# Patient Record
Sex: Female | Born: 1945 | Race: White | Hispanic: No | Marital: Single | Smoking: Never smoker
Health system: Southern US, Community
[De-identification: ages and names within clinical notes are randomized; demographics above are authoritative.]

## PROBLEM LIST (undated history)

## (undated) DIAGNOSIS — Z9889 Other specified postprocedural states: Secondary | ICD-10-CM

## (undated) DIAGNOSIS — F32A Depression, unspecified: Secondary | ICD-10-CM

## (undated) DIAGNOSIS — R519 Headache, unspecified: Secondary | ICD-10-CM

## (undated) DIAGNOSIS — M1990 Unspecified osteoarthritis, unspecified site: Secondary | ICD-10-CM

## (undated) DIAGNOSIS — F329 Major depressive disorder, single episode, unspecified: Secondary | ICD-10-CM

## (undated) DIAGNOSIS — M797 Fibromyalgia: Secondary | ICD-10-CM

## (undated) DIAGNOSIS — T4145XA Adverse effect of unspecified anesthetic, initial encounter: Secondary | ICD-10-CM

## (undated) DIAGNOSIS — T8859XA Other complications of anesthesia, initial encounter: Secondary | ICD-10-CM

## (undated) DIAGNOSIS — R112 Nausea with vomiting, unspecified: Secondary | ICD-10-CM

## (undated) DIAGNOSIS — C801 Malignant (primary) neoplasm, unspecified: Secondary | ICD-10-CM

## (undated) DIAGNOSIS — G4730 Sleep apnea, unspecified: Secondary | ICD-10-CM

## (undated) DIAGNOSIS — E119 Type 2 diabetes mellitus without complications: Secondary | ICD-10-CM

## (undated) DIAGNOSIS — E039 Hypothyroidism, unspecified: Secondary | ICD-10-CM

## (undated) DIAGNOSIS — F419 Anxiety disorder, unspecified: Secondary | ICD-10-CM

## (undated) DIAGNOSIS — I10 Essential (primary) hypertension: Secondary | ICD-10-CM

## (undated) HISTORY — PX: DILATION AND CURETTAGE OF UTERUS: SHX78

## (undated) HISTORY — PX: TONSILLECTOMY: SUR1361

## (undated) HISTORY — DX: Type 2 diabetes mellitus without complications: E11.9

---
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
## 2012-03-08 DIAGNOSIS — F329 Major depressive disorder, single episode, unspecified: Secondary | ICD-10-CM | POA: Insufficient documentation

## 2012-03-08 DIAGNOSIS — IMO0002 Reserved for concepts with insufficient information to code with codable children: Secondary | ICD-10-CM | POA: Insufficient documentation

## 2012-03-08 DIAGNOSIS — F32A Depression, unspecified: Secondary | ICD-10-CM | POA: Insufficient documentation

## 2012-03-08 DIAGNOSIS — I10 Essential (primary) hypertension: Secondary | ICD-10-CM | POA: Insufficient documentation

## 2012-03-08 DIAGNOSIS — G4733 Obstructive sleep apnea (adult) (pediatric): Secondary | ICD-10-CM | POA: Insufficient documentation

## 2012-03-08 DIAGNOSIS — B0239 Other herpes zoster eye disease: Secondary | ICD-10-CM | POA: Insufficient documentation

## 2012-03-08 DIAGNOSIS — E1065 Type 1 diabetes mellitus with hyperglycemia: Secondary | ICD-10-CM | POA: Insufficient documentation

## 2012-05-02 DIAGNOSIS — L255 Unspecified contact dermatitis due to plants, except food: Secondary | ICD-10-CM | POA: Insufficient documentation

## 2012-10-29 DIAGNOSIS — I517 Cardiomegaly: Secondary | ICD-10-CM | POA: Insufficient documentation

## 2012-11-21 DIAGNOSIS — E785 Hyperlipidemia, unspecified: Secondary | ICD-10-CM | POA: Insufficient documentation

## 2013-05-22 DIAGNOSIS — M79609 Pain in unspecified limb: Secondary | ICD-10-CM | POA: Insufficient documentation

## 2013-05-22 DIAGNOSIS — M7120 Synovial cyst of popliteal space [Baker], unspecified knee: Secondary | ICD-10-CM | POA: Insufficient documentation

## 2013-10-02 DIAGNOSIS — G4733 Obstructive sleep apnea (adult) (pediatric): Secondary | ICD-10-CM | POA: Diagnosis not present

## 2013-12-11 DIAGNOSIS — E039 Hypothyroidism, unspecified: Secondary | ICD-10-CM | POA: Insufficient documentation

## 2013-12-11 DIAGNOSIS — E785 Hyperlipidemia, unspecified: Secondary | ICD-10-CM | POA: Diagnosis not present

## 2013-12-11 DIAGNOSIS — E1065 Type 1 diabetes mellitus with hyperglycemia: Secondary | ICD-10-CM | POA: Diagnosis not present

## 2013-12-11 DIAGNOSIS — IMO0001 Reserved for inherently not codable concepts without codable children: Secondary | ICD-10-CM | POA: Diagnosis not present

## 2013-12-11 DIAGNOSIS — IMO0002 Reserved for concepts with insufficient information to code with codable children: Secondary | ICD-10-CM | POA: Diagnosis not present

## 2013-12-11 DIAGNOSIS — I10 Essential (primary) hypertension: Secondary | ICD-10-CM | POA: Diagnosis not present

## 2014-03-05 DIAGNOSIS — N390 Urinary tract infection, site not specified: Secondary | ICD-10-CM | POA: Diagnosis not present

## 2014-04-15 DIAGNOSIS — M26629 Arthralgia of temporomandibular joint, unspecified side: Secondary | ICD-10-CM | POA: Insufficient documentation

## 2014-04-15 DIAGNOSIS — R109 Unspecified abdominal pain: Secondary | ICD-10-CM | POA: Diagnosis not present

## 2014-05-06 DIAGNOSIS — H01009 Unspecified blepharitis unspecified eye, unspecified eyelid: Secondary | ICD-10-CM | POA: Diagnosis not present

## 2014-05-06 DIAGNOSIS — H04129 Dry eye syndrome of unspecified lacrimal gland: Secondary | ICD-10-CM | POA: Diagnosis not present

## 2014-06-17 DIAGNOSIS — H40009 Preglaucoma, unspecified, unspecified eye: Secondary | ICD-10-CM | POA: Diagnosis not present

## 2014-06-17 DIAGNOSIS — E119 Type 2 diabetes mellitus without complications: Secondary | ICD-10-CM | POA: Diagnosis not present

## 2014-06-17 DIAGNOSIS — H2510 Age-related nuclear cataract, unspecified eye: Secondary | ICD-10-CM | POA: Diagnosis not present

## 2014-07-01 DIAGNOSIS — G5622 Lesion of ulnar nerve, left upper limb: Secondary | ICD-10-CM | POA: Diagnosis not present

## 2014-07-01 DIAGNOSIS — M5412 Radiculopathy, cervical region: Secondary | ICD-10-CM | POA: Insufficient documentation

## 2014-07-02 DIAGNOSIS — E1065 Type 1 diabetes mellitus with hyperglycemia: Secondary | ICD-10-CM | POA: Diagnosis not present

## 2014-07-02 DIAGNOSIS — E039 Hypothyroidism, unspecified: Secondary | ICD-10-CM | POA: Diagnosis not present

## 2014-09-15 DIAGNOSIS — M545 Low back pain, unspecified: Secondary | ICD-10-CM | POA: Insufficient documentation

## 2014-09-15 DIAGNOSIS — R102 Pelvic and perineal pain: Secondary | ICD-10-CM | POA: Insufficient documentation

## 2014-09-23 DIAGNOSIS — M545 Low back pain: Secondary | ICD-10-CM | POA: Diagnosis not present

## 2014-09-23 DIAGNOSIS — M5136 Other intervertebral disc degeneration, lumbar region: Secondary | ICD-10-CM | POA: Diagnosis not present

## 2014-09-23 DIAGNOSIS — R102 Pelvic and perineal pain: Secondary | ICD-10-CM | POA: Diagnosis not present

## 2015-04-20 DIAGNOSIS — E119 Type 2 diabetes mellitus without complications: Secondary | ICD-10-CM | POA: Diagnosis not present

## 2015-04-20 DIAGNOSIS — I10 Essential (primary) hypertension: Secondary | ICD-10-CM | POA: Diagnosis not present

## 2015-04-20 DIAGNOSIS — E780 Pure hypercholesterolemia: Secondary | ICD-10-CM | POA: Diagnosis not present

## 2015-04-20 DIAGNOSIS — F322 Major depressive disorder, single episode, severe without psychotic features: Secondary | ICD-10-CM | POA: Diagnosis not present

## 2015-04-20 DIAGNOSIS — E039 Hypothyroidism, unspecified: Secondary | ICD-10-CM | POA: Diagnosis not present

## 2015-04-20 DIAGNOSIS — E11319 Type 2 diabetes mellitus with unspecified diabetic retinopathy without macular edema: Secondary | ICD-10-CM | POA: Diagnosis not present

## 2015-04-20 DIAGNOSIS — M797 Fibromyalgia: Secondary | ICD-10-CM | POA: Diagnosis not present

## 2015-04-20 DIAGNOSIS — Z794 Long term (current) use of insulin: Secondary | ICD-10-CM | POA: Diagnosis not present

## 2015-06-12 DIAGNOSIS — H2513 Age-related nuclear cataract, bilateral: Secondary | ICD-10-CM | POA: Diagnosis not present

## 2015-06-12 DIAGNOSIS — H02831 Dermatochalasis of right upper eyelid: Secondary | ICD-10-CM | POA: Diagnosis not present

## 2015-06-12 DIAGNOSIS — E10329 Type 1 diabetes mellitus with mild nonproliferative diabetic retinopathy without macular edema: Secondary | ICD-10-CM | POA: Diagnosis not present

## 2015-06-25 DIAGNOSIS — M79605 Pain in left leg: Secondary | ICD-10-CM | POA: Diagnosis not present

## 2015-07-10 DIAGNOSIS — E7800 Pure hypercholesterolemia, unspecified: Secondary | ICD-10-CM | POA: Diagnosis not present

## 2015-07-10 DIAGNOSIS — E039 Hypothyroidism, unspecified: Secondary | ICD-10-CM | POA: Diagnosis not present

## 2015-07-10 DIAGNOSIS — E119 Type 2 diabetes mellitus without complications: Secondary | ICD-10-CM | POA: Diagnosis not present

## 2015-07-24 DIAGNOSIS — M25562 Pain in left knee: Secondary | ICD-10-CM | POA: Diagnosis not present

## 2015-08-06 DIAGNOSIS — M1712 Unilateral primary osteoarthritis, left knee: Secondary | ICD-10-CM | POA: Diagnosis not present

## 2015-08-19 DIAGNOSIS — Z6841 Body Mass Index (BMI) 40.0 and over, adult: Secondary | ICD-10-CM | POA: Diagnosis not present

## 2015-08-19 DIAGNOSIS — E039 Hypothyroidism, unspecified: Secondary | ICD-10-CM | POA: Diagnosis not present

## 2015-08-19 DIAGNOSIS — Z13820 Encounter for screening for osteoporosis: Secondary | ICD-10-CM | POA: Diagnosis not present

## 2015-08-19 DIAGNOSIS — E1065 Type 1 diabetes mellitus with hyperglycemia: Secondary | ICD-10-CM | POA: Diagnosis not present

## 2015-08-19 DIAGNOSIS — Z794 Long term (current) use of insulin: Secondary | ICD-10-CM | POA: Diagnosis not present

## 2015-08-28 DIAGNOSIS — E1065 Type 1 diabetes mellitus with hyperglycemia: Secondary | ICD-10-CM | POA: Diagnosis not present

## 2015-08-28 DIAGNOSIS — E039 Hypothyroidism, unspecified: Secondary | ICD-10-CM | POA: Diagnosis not present

## 2015-08-28 DIAGNOSIS — E7800 Pure hypercholesterolemia, unspecified: Secondary | ICD-10-CM | POA: Diagnosis not present

## 2015-08-28 DIAGNOSIS — Z794 Long term (current) use of insulin: Secondary | ICD-10-CM | POA: Diagnosis not present

## 2015-09-11 DIAGNOSIS — Z13820 Encounter for screening for osteoporosis: Secondary | ICD-10-CM | POA: Diagnosis not present

## 2015-09-11 DIAGNOSIS — Z794 Long term (current) use of insulin: Secondary | ICD-10-CM | POA: Diagnosis not present

## 2015-09-11 DIAGNOSIS — E039 Hypothyroidism, unspecified: Secondary | ICD-10-CM | POA: Diagnosis not present

## 2015-09-11 DIAGNOSIS — E1065 Type 1 diabetes mellitus with hyperglycemia: Secondary | ICD-10-CM | POA: Diagnosis not present

## 2015-09-24 DIAGNOSIS — N959 Unspecified menopausal and perimenopausal disorder: Secondary | ICD-10-CM | POA: Diagnosis not present

## 2015-09-25 ENCOUNTER — Ambulatory Visit: Payer: Medicare Other | Admitting: *Deleted

## 2015-11-26 DIAGNOSIS — N3000 Acute cystitis without hematuria: Secondary | ICD-10-CM | POA: Diagnosis not present

## 2016-06-14 DIAGNOSIS — F322 Major depressive disorder, single episode, severe without psychotic features: Secondary | ICD-10-CM | POA: Diagnosis not present

## 2016-06-14 DIAGNOSIS — E039 Hypothyroidism, unspecified: Secondary | ICD-10-CM | POA: Diagnosis not present

## 2016-06-14 DIAGNOSIS — J069 Acute upper respiratory infection, unspecified: Secondary | ICD-10-CM | POA: Diagnosis not present

## 2016-06-14 DIAGNOSIS — M797 Fibromyalgia: Secondary | ICD-10-CM | POA: Diagnosis not present

## 2016-06-14 DIAGNOSIS — E7800 Pure hypercholesterolemia, unspecified: Secondary | ICD-10-CM | POA: Diagnosis not present

## 2016-07-08 DIAGNOSIS — Z794 Long term (current) use of insulin: Secondary | ICD-10-CM | POA: Diagnosis not present

## 2016-07-08 DIAGNOSIS — E1065 Type 1 diabetes mellitus with hyperglycemia: Secondary | ICD-10-CM | POA: Diagnosis not present

## 2016-07-08 DIAGNOSIS — E039 Hypothyroidism, unspecified: Secondary | ICD-10-CM | POA: Diagnosis not present

## 2016-08-12 ENCOUNTER — Encounter: Payer: Medicare Other | Attending: Internal Medicine | Admitting: *Deleted

## 2016-08-12 DIAGNOSIS — E109 Type 1 diabetes mellitus without complications: Secondary | ICD-10-CM | POA: Diagnosis not present

## 2016-08-12 DIAGNOSIS — E108 Type 1 diabetes mellitus with unspecified complications: Secondary | ICD-10-CM

## 2016-08-12 NOTE — Progress Notes (Signed)
Diabetes Self-Management Education  Visit Type: First/Initial  Appt. Start Time: 1000 Appt. End Time: 1100  08/12/2016  Ms. Christine Mcknight, identified by name and date of birth, is a 69 y.o. female with a diagnosis of Diabetes: Type 1. She developed Type 1 Diabetes at age 52, almost 59 years ago. She lives with her daughter and 2 grand daughters to help with child care. Her other daughter developed DM 1 at age 40, eventually needed dialysis for about 7 years before getting a pancreatic and kidney transplant. She states she is doing very well now. Patient is interested in being alerted to high and low BG's so would like to know more about Dexcom CGM.  ASSESSMENT  Height 5\' 5"  (1.651 m), weight 248 lb 9.6 oz (112.8 kg). Body mass index is 41.37 kg/m.      Diabetes Self-Management Education - 08/12/16 1001      Visit Information   Visit Type First/Initial     Initial Visit   Diabetes Type Type 1   Are you currently following a meal plan? Yes   What type of meal plan do you follow? Low Carb   Are you taking your medications as prescribed? Yes   Date Diagnosed 20 years ago     Health Coping   How would you rate your overall health? Good     Psychosocial Assessment   Self-care barriers None   Self-management support Family   Other persons present Patient   Patient Concerns Monitoring  interested in CGM   Special Needs None   What is the last grade level you completed in school? Graduate school     Complications   Last HgB A1C per patient/outside source 8.8 %   How often do you check your blood sugar? 3-4 times/day   Number of hypoglycemic episodes per month 4  typically around 4 PM   Have you had a dilated eye exam in the past 12 months? Yes   Have you had a dental exam in the past 12 months? No   Are you checking your feet? Yes   How many days per week are you checking your feet? 3     Dietary Intake   Breakfast skips   Lunch 11 AM: sandwich OR yogurt OR 1/2 sandwich  and 1/2 fruit   Snack (afternoon) no   Dinner 5 PM: meat, vegetables, occasionally starchy ones in small serivng   Snack (evening) no   Beverage(s) coffee with cream     Exercise   Exercise Type ADL's   How many days per week to you exercise? 0   How many minutes per day do you exercise? 0   Total minutes per week of exercise 0     Patient Education   Previous Diabetes Education Yes (please comment)   Nutrition management  Carbohydrate counting   Medications Other (comment)  Discussed treatment of high BG with 1/2 correction dose if only 2 hours since last injectionl   Monitoring Other (comment)  Discussed Dexcom CGM as they can be covered by Medicare   Acute complications Other (comment)  discussed prevention of hypoglycemia in the afternoon by having a snack     Individualized Goals (developed by patient)   Nutrition General guidelines for healthy choices and portions discussed   Physical Activity Exercise 5-7 days per week   Medications take my medication as prescribed   Monitoring  Other (comment)  move towards getting Dexcom CGM     Outcomes   Expected Outcomes  Demonstrated interest in learning. Expect positive outcomes   Future DMSE PRN   Program Status Completed      Individualized Plan for Diabetes Self-Management Training:   Learning Objective:  Patient will have a greater understanding of diabetes self-management. Patient education plan is to attend individual and/or group sessions per assessed needs and concerns.   Plan:   Patient Instructions  Plan: We discussed Dexcom CGM today as that product can be covered by Medicare. I have emailed the [facility] Rep so she can contact you and move forward. Consider when giving a correction dose of insulin within 2 hours of a previous injection, to give half the usual amount, as the previous insulin will be in your body for about 4 hours Consider having an afternoon snack of 1/2 to 1 carb choice and some lean protein to  help prevent low BG's in the later afternoon.  When your Dexcom ships, you can be trained by the [facility] rep or by me.  Hope you will be able to attend the Type 1 / Pump Support Group in the future.   Expected Outcomes:  Demonstrated interest in learning. Expect positive outcomes  Education material provided: Snack sheet, Dexcom brochure, Type 1 Support Group Flyer  If problems or questions, patient to contact team via:  Phone, emali  Future DSME appointment: PRN

## 2016-08-12 NOTE — Patient Instructions (Signed)
Plan: We discussed Dexcom CGM today as that product can be covered by Medicare. I have emailed the [facility] Rep so she can contact you and move forward. Consider when giving a correction dose of insulin within 2 hours of a previous injection, to give half the usual amount, as the previous insulin will be in your body for about 4 hours Consider having an afternoon snack of 1/2 to 1 carb choice and some lean protein to help prevent low BG's in the later afternoon.  When your Dexcom ships, you can be trained by the [facility] rep or by me.  Hope you will be able to attend the Type 1 / Pump Support Group in the future.

## 2016-08-31 DIAGNOSIS — Z794 Long term (current) use of insulin: Secondary | ICD-10-CM | POA: Diagnosis not present

## 2016-08-31 DIAGNOSIS — E1065 Type 1 diabetes mellitus with hyperglycemia: Secondary | ICD-10-CM | POA: Diagnosis not present

## 2016-10-09 DIAGNOSIS — Z23 Encounter for immunization: Secondary | ICD-10-CM | POA: Diagnosis not present

## 2016-12-30 DIAGNOSIS — Z794 Long term (current) use of insulin: Secondary | ICD-10-CM | POA: Diagnosis not present

## 2016-12-30 DIAGNOSIS — E1065 Type 1 diabetes mellitus with hyperglycemia: Secondary | ICD-10-CM | POA: Diagnosis not present

## 2016-12-30 DIAGNOSIS — E039 Hypothyroidism, unspecified: Secondary | ICD-10-CM | POA: Diagnosis not present

## 2017-01-12 DIAGNOSIS — N3000 Acute cystitis without hematuria: Secondary | ICD-10-CM | POA: Diagnosis not present

## 2017-02-17 DIAGNOSIS — E039 Hypothyroidism, unspecified: Secondary | ICD-10-CM | POA: Diagnosis not present

## 2017-02-17 DIAGNOSIS — Z794 Long term (current) use of insulin: Secondary | ICD-10-CM | POA: Diagnosis not present

## 2017-02-17 DIAGNOSIS — E1065 Type 1 diabetes mellitus with hyperglycemia: Secondary | ICD-10-CM | POA: Diagnosis not present

## 2017-03-17 ENCOUNTER — Ambulatory Visit: Payer: Medicare Other | Admitting: *Deleted

## 2017-03-31 ENCOUNTER — Encounter: Payer: Medicare Other | Attending: Internal Medicine | Admitting: *Deleted

## 2017-03-31 DIAGNOSIS — E1065 Type 1 diabetes mellitus with hyperglycemia: Secondary | ICD-10-CM | POA: Diagnosis not present

## 2017-03-31 DIAGNOSIS — E108 Type 1 diabetes mellitus with unspecified complications: Secondary | ICD-10-CM

## 2017-03-31 NOTE — Progress Notes (Signed)
Insulin Pump and / or CGM Evaluation Visit:  Date: 03/31/2017   Appt start time: 0800 end time:  0900.  Assessment:  This patient has DM 1 and their primary concerns today: to move towards Omnipod insulin pump. She already has the Dexcom CGM and likes it very much.   Patient currently is retired and takes care of her 2 grand children who live with her and her daughter Last A1c was 9.0%   Current barriers to managing their diabetes: Patient states:  they have had hypoglycemia 1-2 times in the past month  Frustrated with elevated A1c  MEDICATIONS: Basal Insulin: 28 units of Tresiba at night  Bolus Insulin: 10-15 units of Humalog at each meal  v Total Daily Dose of insulin per day 68  Progress Towards Obtaining an Insulin Pump Goal(s):   Patient states their expectations of pump therapy include: better control and ability to suspend or decrease basal insulin to prevent hypoglycemia Patient expresses understanding that for improved outcomes for their diabetes on an insulin pump they will:  During the first 1-2 weeks (or more) of new pump use, the patient will test BG pre and 2 hour post each meal, bedtime and 3 AM to assist the provider in evaluating accuracy of pump settings and to prevent DKA   Will test BG at least 4 times per day once evaluation time is completed  Change out pump infusion set at least every 3 days  Upload pump information to their pump's Web Based Program on a regular basis so provider can assess patterns and make setting adjustments.     Intervention:    Showed patient the following pumps: OmniPod per her request  Showed patient the following CGM: Medtronic, Dexcom  Demonstrated pump, insulin reservoir and infusion set options, and button pushing for bolus delivery of insulin through the pump  Patient aware of importance of testing BG at least 4 times per day for appropriate correction of high BG and prevention of DKA as applicable.  Emphasized importance of  follow up after Pump Start for appropriate pump setting adjustments and on-going training on more advanced features.  Patient informed of DM 1 / Pump Support Group activities and would like to be added to the email list.  Handouts given during visit include:  Insulin Pump and /or CGM Packet from Omnipod  Monitoring/Evaluation:    Patient does want to continue with pursuit of Omnipod insulin pump.    Patient asked me to contact local [facility] so they can start the process of obtaining the pump. I emailed them today. Contact information provided to the patient.  Once pump / CGM is shipped, patient to call this office to set up training.

## 2017-06-06 DIAGNOSIS — Z794 Long term (current) use of insulin: Secondary | ICD-10-CM | POA: Diagnosis not present

## 2017-06-06 DIAGNOSIS — E1065 Type 1 diabetes mellitus with hyperglycemia: Secondary | ICD-10-CM | POA: Diagnosis not present

## 2017-06-06 DIAGNOSIS — E039 Hypothyroidism, unspecified: Secondary | ICD-10-CM | POA: Diagnosis not present

## 2017-06-06 DIAGNOSIS — D692 Other nonthrombocytopenic purpura: Secondary | ICD-10-CM | POA: Diagnosis not present

## 2017-08-10 DIAGNOSIS — Z23 Encounter for immunization: Secondary | ICD-10-CM | POA: Diagnosis not present

## 2017-08-11 DIAGNOSIS — E039 Hypothyroidism, unspecified: Secondary | ICD-10-CM | POA: Diagnosis not present

## 2017-10-02 DIAGNOSIS — E039 Hypothyroidism, unspecified: Secondary | ICD-10-CM | POA: Diagnosis not present

## 2017-10-03 DIAGNOSIS — Z794 Long term (current) use of insulin: Secondary | ICD-10-CM | POA: Diagnosis not present

## 2017-10-03 DIAGNOSIS — E1065 Type 1 diabetes mellitus with hyperglycemia: Secondary | ICD-10-CM | POA: Diagnosis not present

## 2017-10-03 DIAGNOSIS — E039 Hypothyroidism, unspecified: Secondary | ICD-10-CM | POA: Diagnosis not present

## 2018-01-16 DIAGNOSIS — Z6841 Body Mass Index (BMI) 40.0 and over, adult: Secondary | ICD-10-CM | POA: Diagnosis not present

## 2018-01-16 DIAGNOSIS — E1065 Type 1 diabetes mellitus with hyperglycemia: Secondary | ICD-10-CM | POA: Diagnosis not present

## 2018-01-16 DIAGNOSIS — E039 Hypothyroidism, unspecified: Secondary | ICD-10-CM | POA: Diagnosis not present

## 2018-01-16 DIAGNOSIS — Z794 Long term (current) use of insulin: Secondary | ICD-10-CM | POA: Diagnosis not present

## 2018-02-09 DIAGNOSIS — Z23 Encounter for immunization: Secondary | ICD-10-CM | POA: Diagnosis not present

## 2018-02-09 DIAGNOSIS — F322 Major depressive disorder, single episode, severe without psychotic features: Secondary | ICD-10-CM | POA: Diagnosis not present

## 2018-02-09 DIAGNOSIS — E7800 Pure hypercholesterolemia, unspecified: Secondary | ICD-10-CM | POA: Diagnosis not present

## 2018-04-06 DIAGNOSIS — E109 Type 1 diabetes mellitus without complications: Secondary | ICD-10-CM | POA: Diagnosis not present

## 2018-04-06 DIAGNOSIS — G4733 Obstructive sleep apnea (adult) (pediatric): Secondary | ICD-10-CM | POA: Diagnosis not present

## 2018-04-06 DIAGNOSIS — E669 Obesity, unspecified: Secondary | ICD-10-CM | POA: Diagnosis not present

## 2018-04-06 DIAGNOSIS — E039 Hypothyroidism, unspecified: Secondary | ICD-10-CM | POA: Diagnosis not present

## 2018-05-14 DIAGNOSIS — E039 Hypothyroidism, unspecified: Secondary | ICD-10-CM | POA: Diagnosis not present

## 2018-05-14 DIAGNOSIS — Z6841 Body Mass Index (BMI) 40.0 and over, adult: Secondary | ICD-10-CM | POA: Diagnosis not present

## 2018-05-14 DIAGNOSIS — Z794 Long term (current) use of insulin: Secondary | ICD-10-CM | POA: Diagnosis not present

## 2018-05-14 DIAGNOSIS — E1065 Type 1 diabetes mellitus with hyperglycemia: Secondary | ICD-10-CM | POA: Diagnosis not present

## 2018-05-29 ENCOUNTER — Encounter: Payer: Self-pay | Admitting: Registered"

## 2018-05-29 ENCOUNTER — Encounter: Payer: Medicare Other | Attending: Surgery | Admitting: Registered"

## 2018-05-29 DIAGNOSIS — Z713 Dietary counseling and surveillance: Secondary | ICD-10-CM | POA: Diagnosis not present

## 2018-05-29 DIAGNOSIS — E669 Obesity, unspecified: Secondary | ICD-10-CM | POA: Insufficient documentation

## 2018-05-29 DIAGNOSIS — E109 Type 1 diabetes mellitus without complications: Secondary | ICD-10-CM

## 2018-05-29 NOTE — Progress Notes (Signed)
Pre-Op Assessment Visit:  Pre-Operative Sleeve Gastrectomy Surgery  Medical Nutrition Therapy:  Appt start time: 10:10 End time: 11:16  Patient was seen on 05/29/2018 for Pre-Operative Nutrition Assessment. Assessment and letter of approval faxed to [facility] Bariatric Surgery Program coordinator on 05/29/2018.   Pt expectation of surgery: reduction in daily insulin, improve energy, increase ability to move, improve joint pain  Pt expectation of Dietitian: none stated  Start weight at [facility]: 262.6 BMI: 44.73   Pt states she is taking 100 units of insulin a day and wants to reduce insulin intake. Pt states she feels enormously heavy and feels she will be physically better if weight decreases. 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 Pt states she helps take care of 2 granddaughters (ages 47 and 59). Pt states it is going to be rough to give up diet Coke but knows she can do it.   Per insurance, pt needs 0 SWL visits prior to surgery. Pt states her expected surgery date is February 2020.   24 hr Dietary Recall: First Meal: cereal Snack: none  Second Meal: sandwich (tuna or egg or [place]) + 1/2 fruit or boiled egg + [place] cheese roll up or leftovers Snack: none Third Meal: chili (with beans and corn) + sugar-free jello or barbecue chicken + corn Snack: none Beverages: Lactaid whole milk, 2 c coffee, sparkling water, diet coke, propel  Encouraged to engage in 75 minutes of moderate physical activity including cardiovascular and weight baring weekly  Handouts given during visit include:  . Pre-Op Goals . Bariatric Surgery Protein Shakes . Vitamin and Mineral Recommendations  During the appointment today the following Pre-Op Goals were reviewed with the patient: . Track your food and beverage: MyFitness Pal or Baritastic App . Make healthy food choices . Begin to limit portion sizes . Limited concentrated sugars and fried foods . Keep fat/sugar in the single digits per  serving on         food labels . Practice CHEWING your food  (aim for 30 chews per bite or until applesauce consistency) . Practice not drinking 15 minutes before, during, and 30 minutes after each meal/snack . Avoid all carbonated beverages  . Avoid/limit caffeinated beverages  . Avoid all sugar-sweetened beverages . Avoid alcohol . Consume 3 meals per day; eat every 3-5 hours . Make a list of non-food related activities . Aim for 64-100 ounces of FLUID daily  . Aim for at least 60-80 grams of PROTEIN daily . Look for a liquid protein source that contain ?15 g protein and ?5 g carbohydrate  (ex: shakes, drinks, shots) . Physical activity is an important part of a healthy lifestyle so keep it moving!  Follow diet recommendations listed below Energy and Macronutrient Recommendations: Calories: 1600 Carbohydrate: 180 Protein: 120 Fat: 44  Demonstrated degree of understanding via:  Teach Back   Teaching Method Utilized:  Visual Auditory Hands on  Barriers to learning/adherence to lifestyle change: none identified  Patient to call the [facility] to enroll in Pre-Op and Post-Op Nutrition Education when surgery date is scheduled.

## 2018-08-09 DIAGNOSIS — K5730 Diverticulosis of large intestine without perforation or abscess without bleeding: Secondary | ICD-10-CM | POA: Diagnosis not present

## 2018-08-09 DIAGNOSIS — K635 Polyp of colon: Secondary | ICD-10-CM | POA: Diagnosis not present

## 2018-08-09 DIAGNOSIS — D123 Benign neoplasm of transverse colon: Secondary | ICD-10-CM | POA: Diagnosis not present

## 2018-08-09 DIAGNOSIS — Z86010 Personal history of colonic polyps: Secondary | ICD-10-CM | POA: Diagnosis not present

## 2018-08-14 DIAGNOSIS — K635 Polyp of colon: Secondary | ICD-10-CM | POA: Diagnosis not present

## 2018-08-14 DIAGNOSIS — D123 Benign neoplasm of transverse colon: Secondary | ICD-10-CM | POA: Diagnosis not present

## 2018-08-21 DIAGNOSIS — M1990 Unspecified osteoarthritis, unspecified site: Secondary | ICD-10-CM | POA: Diagnosis not present

## 2018-08-21 DIAGNOSIS — E039 Hypothyroidism, unspecified: Secondary | ICD-10-CM | POA: Diagnosis not present

## 2018-08-21 DIAGNOSIS — E1065 Type 1 diabetes mellitus with hyperglycemia: Secondary | ICD-10-CM | POA: Diagnosis not present

## 2018-08-21 DIAGNOSIS — Z23 Encounter for immunization: Secondary | ICD-10-CM | POA: Diagnosis not present

## 2018-08-21 DIAGNOSIS — Z794 Long term (current) use of insulin: Secondary | ICD-10-CM | POA: Diagnosis not present

## 2018-08-30 ENCOUNTER — Ambulatory Visit: Payer: Medicare Other | Admitting: Podiatry

## 2018-08-31 ENCOUNTER — Ambulatory Visit (INDEPENDENT_AMBULATORY_CARE_PROVIDER_SITE_OTHER): Payer: Medicare Other | Admitting: Podiatry

## 2018-08-31 VITALS — BP 145/76

## 2018-08-31 DIAGNOSIS — Z794 Long term (current) use of insulin: Secondary | ICD-10-CM

## 2018-08-31 DIAGNOSIS — L84 Corns and callosities: Secondary | ICD-10-CM

## 2018-08-31 DIAGNOSIS — IMO0001 Reserved for inherently not codable concepts without codable children: Secondary | ICD-10-CM

## 2018-08-31 DIAGNOSIS — E119 Type 2 diabetes mellitus without complications: Secondary | ICD-10-CM | POA: Diagnosis not present

## 2018-08-31 NOTE — Patient Instructions (Signed)
Diabetes and Foot Care Diabetes may cause you to have problems because of poor blood supply (circulation) to your feet and legs. This may cause the skin on your feet to become thinner, break easier, and heal more slowly. Your skin may become dry, and the skin may peel and crack. You may also have nerve damage in your legs and feet causing decreased feeling in them. You may not notice minor injuries to your feet that could lead to infections or more serious problems. Taking care of your feet is one of the most important things you can do for yourself. Follow these instructions at home:  Wear shoes at all times, even in the house. Do not go barefoot. Bare feet are easily injured.  Check your feet daily for blisters, cuts, and redness. If you cannot see the bottom of your feet, use a mirror or ask someone for help.  Wash your feet with warm water (do not use hot water) and mild soap. Then pat your feet and the areas between your toes until they are completely dry. Do not soak your feet as this can dry your skin.  Apply a moisturizing lotion or petroleum jelly (that does not contain alcohol and is unscented) to the skin on your feet and to dry, brittle toenails. Do not apply lotion between your toes.  Trim your toenails straight across. Do not dig under them or around the cuticle. File the edges of your nails with an emery board or nail file.  Do not cut corns or calluses or try to remove them with medicine.  Wear clean socks or stockings every day. Make sure they are not too tight. Do not wear knee-high stockings since they may decrease blood flow to your legs.  Wear shoes that fit properly and have enough cushioning. To break in new shoes, wear them for just a few hours a day. This prevents you from injuring your feet. Always look in your shoes before you put them on to be sure there are no objects inside.  Do not cross your legs. This may decrease the blood flow to your feet.  If you find a  minor scrape, cut, or break in the skin on your feet, keep it and the skin around it clean and dry. These areas may be cleansed with mild soap and water. Do not cleanse the area with peroxide, alcohol, or iodine.  When you remove an adhesive bandage, be sure not to damage the skin around it.  If you have a wound, look at it several times a day to make sure it is healing.  Do not use heating pads or hot water bottles. They may burn your skin. If you have lost feeling in your feet or legs, you may not know it is happening until it is too late.  Make sure your health care provider performs a complete foot exam at least annually or more often if you have foot problems. Report any cuts, sores, or bruises to your health care provider immediately. Contact a health care provider if:  You have an injury that is not healing.  You have cuts or breaks in the skin.  You have an ingrown nail.  You notice redness on your legs or feet.  You feel burning or tingling in your legs or feet.  You have pain or cramps in your legs and feet.  Your legs or feet are numb.  Your feet always feel cold. Get help right away if:  There is increasing   redness, swelling, or pain in or around a wound.  There is a red line that goes up your leg.  Pus is coming from a wound.  You develop a fever or as directed by your health care provider.  You notice a bad smell coming from an ulcer or wound. This information is not intended to replace advice given to you by your health care provider. Make sure you discuss any questions you have with your health care provider. Document Released: 09/09/2000 Document Revised: 02/18/2016 Document Reviewed: 02/19/2013 Elsevier Interactive Patient Education  2017 Elsevier Inc.  Corns and Calluses Corns are small areas of thickened skin that occur on the top, sides, or tip of a toe. They contain a cone-shaped core with a point that can press on a nerve below. This causes pain.  Calluses are areas of thickened skin that can occur anywhere on the body including hands, fingers, palms, soles of the feet, and heels.Calluses are usually larger than corns. What are the causes? Corns and calluses are caused by rubbing (friction) or pressure, such as from shoes that are too tight or do not fit properly. What increases the risk? Corns are more likely to develop in people who have toe deformities, such as hammer toes. Since calluses can occur with friction to any area of the skin, calluses are more likely to develop in people who: Work with their hands. Wear shoes that fit poorly, shoes that are too tight, or shoes that are high-heeled. Have toes deformities.  What are the signs or symptoms? Symptoms of a corn or callus include: A hard growth on the skin. Pain or tenderness under the skin. Redness and swelling. Increased discomfort while wearing tight-fitting shoes.  How is this diagnosed? Corns and calluses may be diagnosed with a medical history and physical exam. How is this treated? Corns and calluses may be treated with: Removing the cause of the friction or pressure. This may include: Changing your shoes. Wearing shoe inserts (orthotics) or other protective layers in your shoes, such as a corn pad. Wearing gloves. Medicines to help soften skin in the hardened, thickened areas. Reducing the size of the corn or callus by removing the dead layers of skin. Antibiotic medicines to treat infection. Surgery, if a toe deformity is the cause.  Follow these instructions at home: Take medicines only as directed by your health care provider. If you were prescribed an antibiotic, finish all of it even if you start to feel better. Wear shoes that fit well. Avoid wearing high-heeled shoes and shoes that are too tight or too loose. Wear any padding, protective layers, gloves, or orthotics as directed by your health care provider. Soak your hands or feet and then use a file  or pumice stone to soften your corn or callus. Do this as directed by your health care provider. Check your corn or callus every day for signs of infection. Watch for: Redness, swelling, or pain. Fluid, blood, or pus. Contact a health care provider if: Your symptoms do not improve with treatment. You have increased redness, swelling, or pain at the site of your corn or callus. You have fluid, blood, or pus coming from your corn or callus. You have new symptoms. This information is not intended to replace advice given to you by your health care provider. Make sure you discuss any questions you have with your health care provider. Document Released: 06/18/2004 Document Revised: 04/01/2016 Document Reviewed: 09/08/2014 Elsevier Interactive Patient Education  2018 Elsevier Inc.  

## 2018-09-11 DIAGNOSIS — M7989 Other specified soft tissue disorders: Secondary | ICD-10-CM | POA: Diagnosis not present

## 2018-09-11 DIAGNOSIS — Z6841 Body Mass Index (BMI) 40.0 and over, adult: Secondary | ICD-10-CM | POA: Diagnosis not present

## 2018-09-11 DIAGNOSIS — M2550 Pain in unspecified joint: Secondary | ICD-10-CM | POA: Diagnosis not present

## 2018-09-11 DIAGNOSIS — R5383 Other fatigue: Secondary | ICD-10-CM | POA: Diagnosis not present

## 2018-09-11 DIAGNOSIS — M150 Primary generalized (osteo)arthritis: Secondary | ICD-10-CM | POA: Diagnosis not present

## 2018-09-11 DIAGNOSIS — R768 Other specified abnormal immunological findings in serum: Secondary | ICD-10-CM | POA: Diagnosis not present

## 2018-09-30 ENCOUNTER — Encounter: Payer: Self-pay | Admitting: Podiatry

## 2018-09-30 NOTE — Progress Notes (Signed)
Subjective: Christine Mcknight presents today referred by Jonathon Jordan, MD with diabetes. She is here for diabetic foot examination.   Past Medical History:  Diagnosis Date  . Diabetes mellitus without complication [facility])    Patient Active Problem List   Diagnosis Date Noted  . Low back pain 09/15/2014  . Pain, female pelvic 09/15/2014  . 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 Arthralgia of temporomandibular joint 04/15/2014  . Hypothyroidism 12/11/2013  . Pain in soft tissues of limb 05/22/2013  . Synovial cyst of popliteal space 05/22/2013  . Hyperlipidemia 11/21/2012  . LVH (left ventricular hypertrophy) 10/29/2012  . Contact dermatitis due to plants, except food 05/02/2012  . Benign essential hypertension 03/08/2012  . Depression 03/08/2012  . 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 Obstructive sleep apnea 03/08/2012  . Type I diabetes mellitus, uncontrolled ([place]) 03/08/2012    No past surgical history on file.   Current Outpatient Medications:  .  DULoxetine (CYMBALTA) 60 MG capsule, Take 60 mg by mouth daily., Disp: , Rfl:  .  FIASP FLEXTOUCH 100 UNIT/ML SOPN, , Disp: , Rfl:  .  levothyroxine (SYNTHROID) 125 MCG tablet, Take 125 mcg by mouth daily before breakfast., Disp: , Rfl:  .  polyethylene glycol-electrolytes (NULYTELY/GOLYTELY) 420 g solution, U UTD, Disp: , Rfl: 0 .  pravastatin (PRAVACHOL) 20 MG tablet, Take 20 mg by mouth daily., Disp: , Rfl:  .  SYNTHROID 112 MCG tablet, TK 1 T PO QD IN THE MORNING OES, Disp: , Rfl: 4 .  insulin degludec (TRESIBA FLEXTOUCH) 100 UNIT/ML SOPN FlexTouch Pen, Inject 28 Units into the skin daily at 10 pm., Disp: , Rfl:  .  insulin glargine (LANTUS) 100 UNIT/ML injection, Inject 26 Units into the skin daily., Disp: , Rfl:  .  insulin lispro (HUMALOG) 100 UNIT/ML injection, Inject into the skin 3 (three) times daily before meals. Sliding scale @@ 1 unit / 5 grams carb and 1 unit / 20 mg/dl above target of 100 mg/dl, Disp: , Rfl:   No  Known Allergies  Social History   Occupational History  . Not on file  Tobacco Use  . Smoking status: Not on file  Substance and Sexual Activity  . Alcohol use: Not on file  . Drug use: Not on file  . Sexual activity: Not on file    Family History  Problem Relation Age of Onset  . Cancer Other   . Diabetes Other      There is no immunization history on file for this patient.   Review of systems: Positive Findings in bold print.  Constitutional:  chills, fatigue, fever, sweats, weight change Communication: Optometrist, sign Ecologist, hand writing, iPad/Android device Eyes: diplopia, glare,  light sensitivity, eyeglasses, blindness Ears nose mouth throat: Hard of hearing, deaf, sign language,  vertigo,   bloody nose,  rhinitis,  cold sores, snoring Cardiovascular: HTN, edema, arrhythmia, pacemaker in place, defibrillator in place,  chest pain/tightness, chronic anticoagulation, blood clot Respiratory:  difficulty breathing, denies congestion, SOB, wheezing, cough Gastrointestinal: abdominal pain, diarrhea, nausea, vomiting,  Genitourinary:  nocturia,  pain on urination,  blood in urine, Foley catheter, urinary urgency Musculoskeletal: Uses mobility aid,  cramping, stiff joints, painful joints, low back pain Skin: +changes in toenails, color change dryness, itchy skin, mole changes, rash Neurological: numbness, paresthesias, burning in feet, denies fainting,  seizure, change in speech. denies headaches, memory problems/poor historian, cerebral palsy Endocrine: diabetes, hypothyroidism, hyperthyroidism,  dry mouth, flushing, denies heat intolerance,  cold intolerance,  excessive thirst,  denies polyuria,  nocturia Hematological:  easy bleeding,  excessive bleeding, easy bruising, enlarged lymph nodes, on long term blood thinner Allergy/immunological:  hive, frequent infections, multiple drug allergies, seasonal allergies,  Psychiatric:  anxiety, depression, mood disorder,  suicidal ideations, hallucinations   Objective: Vascular Examination: Capillary refill time immediate x 10 digits Dorsalis pedis and posterior tibial pulses present b/l Digital hair present x 10 digits Skin temperature gradient WNL b/l  Dermatological Examination: Skin with normal turgor, texture and tone b/l  Toenails 1-5 b/l adequate length and well maintained.  Hyperkeratotic lesion submetatarsal head 1 b/l  Musculoskeletal: Muscle strength 5/5 to all LE muscle groups  HAV with bunion b/l  Hammertoes 2-5 b/l   Neurological: Sensation intact with 10 gram monofilament  Vibratory sensation intact.  Assessment: 1. Calluses submet head 1 b/l 2. IDDM   Plan: 1. Discussed diabetic foot care principles. Literature dispensed on today. 2. Calluses pared submet head 1 b/l 3. Patient to continue soft, supportive shoe gear 4. Patient to report any pedal injuries to medical professional immediately. 5. Follow up 3 months. Patient/POA to call should there be a concern in the interim.

## 2018-10-02 DIAGNOSIS — Z6841 Body Mass Index (BMI) 40.0 and over, adult: Secondary | ICD-10-CM | POA: Diagnosis not present

## 2018-10-02 DIAGNOSIS — M150 Primary generalized (osteo)arthritis: Secondary | ICD-10-CM | POA: Diagnosis not present

## 2018-10-02 DIAGNOSIS — M7989 Other specified soft tissue disorders: Secondary | ICD-10-CM | POA: Diagnosis not present

## 2018-10-02 DIAGNOSIS — M2550 Pain in unspecified joint: Secondary | ICD-10-CM | POA: Diagnosis not present

## 2018-10-02 DIAGNOSIS — R768 Other specified abnormal immunological findings in serum: Secondary | ICD-10-CM | POA: Diagnosis not present

## 2018-10-02 DIAGNOSIS — R5383 Other fatigue: Secondary | ICD-10-CM | POA: Diagnosis not present

## 2018-10-08 ENCOUNTER — Encounter: Payer: Medicare Other | Attending: Surgery | Admitting: Skilled Nursing Facility1

## 2018-10-08 DIAGNOSIS — E669 Obesity, unspecified: Secondary | ICD-10-CM

## 2018-10-08 NOTE — Progress Notes (Signed)
Pre-Operative Nutrition Class:  Appt start time: 2707   End time:  1830.  Patient was seen on 10/08/2018 for Pre-Operative Bariatric Surgery Education [facility].   Surgery date:  Surgery type: sleeve Start weight at [facility]: 262.6 Weight today: 273.5  Samples given per MNT protocol. Patient educated on appropriate usage: Bariatric Advantage Multivitamin Lot # [id number] Exp: 4/21  Bariatric Advantage Calcium  Lot # [id number] Exp: 04/20/2019  Renee Pain Protein Shake Lot # [id number] Exp: march, 302020   The following the learning objectives were met by the patient during this course:  Identify Pre-Op Dietary Goals and will begin 2 weeks pre-operatively  Identify appropriate sources of fluids and proteins   State protein recommendations and appropriate sources pre and post-operatively  Identify Post-Operative Dietary Goals and will follow for 2 weeks post-operatively  Identify appropriate multivitamin and calcium sources  Describe the need for physical activity post-operatively and will follow MD recommendations  State when to call healthcare provider regarding medication questions or post-operative complications  Handouts given during class include:  Pre-Op Bariatric Surgery Diet Handout  Protein Shake Handout  Post-Op Bariatric Surgery Nutrition Handout  BELT Program Information Flyer  Support Group Information Flyer  [facility] Bariatric Supplements Price List  Follow-Up Plan: Patient will follow-up at [facility] 2 weeks post operatively for diet advancement per MD.

## 2018-10-09 ENCOUNTER — Ambulatory Visit (INDEPENDENT_AMBULATORY_CARE_PROVIDER_SITE_OTHER): Payer: Medicare Other | Admitting: Psychiatry

## 2018-10-09 DIAGNOSIS — F509 Eating disorder, unspecified: Secondary | ICD-10-CM | POA: Diagnosis not present

## 2018-11-06 DIAGNOSIS — M1990 Unspecified osteoarthritis, unspecified site: Secondary | ICD-10-CM | POA: Diagnosis not present

## 2018-11-06 DIAGNOSIS — E1065 Type 1 diabetes mellitus with hyperglycemia: Secondary | ICD-10-CM | POA: Diagnosis not present

## 2018-11-06 DIAGNOSIS — E039 Hypothyroidism, unspecified: Secondary | ICD-10-CM | POA: Diagnosis not present

## 2018-11-06 DIAGNOSIS — Z794 Long term (current) use of insulin: Secondary | ICD-10-CM | POA: Diagnosis not present

## 2018-11-20 ENCOUNTER — Ambulatory Visit (INDEPENDENT_AMBULATORY_CARE_PROVIDER_SITE_OTHER): Payer: Medicare Other | Admitting: Psychiatry

## 2018-11-20 DIAGNOSIS — F509 Eating disorder, unspecified: Secondary | ICD-10-CM

## 2018-11-20 DIAGNOSIS — E1065 Type 1 diabetes mellitus with hyperglycemia: Secondary | ICD-10-CM | POA: Diagnosis not present

## 2018-11-30 ENCOUNTER — Ambulatory Visit: Payer: Medicare Other | Admitting: Podiatry

## 2018-12-26 ENCOUNTER — Ambulatory Visit: Payer: Medicare Other | Admitting: Podiatry

## 2019-02-20 DIAGNOSIS — Z794 Long term (current) use of insulin: Secondary | ICD-10-CM | POA: Diagnosis not present

## 2019-02-20 DIAGNOSIS — E1065 Type 1 diabetes mellitus with hyperglycemia: Secondary | ICD-10-CM | POA: Diagnosis not present

## 2019-02-20 DIAGNOSIS — E039 Hypothyroidism, unspecified: Secondary | ICD-10-CM | POA: Diagnosis not present

## 2019-03-04 DIAGNOSIS — M2550 Pain in unspecified joint: Secondary | ICD-10-CM | POA: Diagnosis not present

## 2019-03-04 DIAGNOSIS — R768 Other specified abnormal immunological findings in serum: Secondary | ICD-10-CM | POA: Diagnosis not present

## 2019-03-04 DIAGNOSIS — M7989 Other specified soft tissue disorders: Secondary | ICD-10-CM | POA: Diagnosis not present

## 2019-03-04 DIAGNOSIS — R5383 Other fatigue: Secondary | ICD-10-CM | POA: Diagnosis not present

## 2019-03-04 DIAGNOSIS — M150 Primary generalized (osteo)arthritis: Secondary | ICD-10-CM | POA: Diagnosis not present

## 2019-03-05 DIAGNOSIS — M797 Fibromyalgia: Secondary | ICD-10-CM | POA: Diagnosis not present

## 2019-03-05 DIAGNOSIS — Z79899 Other long term (current) drug therapy: Secondary | ICD-10-CM | POA: Diagnosis not present

## 2019-03-05 DIAGNOSIS — Z1159 Encounter for screening for other viral diseases: Secondary | ICD-10-CM | POA: Diagnosis not present

## 2019-03-05 DIAGNOSIS — F322 Major depressive disorder, single episode, severe without psychotic features: Secondary | ICD-10-CM | POA: Diagnosis not present

## 2019-03-05 DIAGNOSIS — E7800 Pure hypercholesterolemia, unspecified: Secondary | ICD-10-CM | POA: Diagnosis not present

## 2019-04-02 DIAGNOSIS — I10 Essential (primary) hypertension: Secondary | ICD-10-CM | POA: Diagnosis not present

## 2019-04-04 ENCOUNTER — Other Ambulatory Visit: Payer: Self-pay | Admitting: Surgery

## 2019-04-04 ENCOUNTER — Other Ambulatory Visit (HOSPITAL_COMMUNITY): Payer: Self-pay | Admitting: Surgery

## 2019-04-04 DIAGNOSIS — E669 Obesity, unspecified: Secondary | ICD-10-CM

## 2019-04-09 ENCOUNTER — Other Ambulatory Visit (HOSPITAL_COMMUNITY): Payer: Self-pay | Admitting: Surgery

## 2019-04-09 ENCOUNTER — Other Ambulatory Visit: Payer: Self-pay | Admitting: Surgery

## 2019-04-09 DIAGNOSIS — E669 Obesity, unspecified: Secondary | ICD-10-CM

## 2019-04-16 DIAGNOSIS — E039 Hypothyroidism, unspecified: Secondary | ICD-10-CM | POA: Diagnosis not present

## 2019-04-16 DIAGNOSIS — I10 Essential (primary) hypertension: Secondary | ICD-10-CM | POA: Diagnosis not present

## 2019-04-16 DIAGNOSIS — E559 Vitamin D deficiency, unspecified: Secondary | ICD-10-CM | POA: Diagnosis not present

## 2019-04-16 DIAGNOSIS — E1065 Type 1 diabetes mellitus with hyperglycemia: Secondary | ICD-10-CM | POA: Diagnosis not present

## 2019-04-16 DIAGNOSIS — M797 Fibromyalgia: Secondary | ICD-10-CM | POA: Diagnosis not present

## 2019-04-16 DIAGNOSIS — E7800 Pure hypercholesterolemia, unspecified: Secondary | ICD-10-CM | POA: Diagnosis not present

## 2019-04-16 DIAGNOSIS — Z794 Long term (current) use of insulin: Secondary | ICD-10-CM | POA: Diagnosis not present

## 2019-04-16 DIAGNOSIS — Z79899 Other long term (current) drug therapy: Secondary | ICD-10-CM | POA: Diagnosis not present

## 2019-04-16 DIAGNOSIS — Z1159 Encounter for screening for other viral diseases: Secondary | ICD-10-CM | POA: Diagnosis not present

## 2019-04-24 ENCOUNTER — Other Ambulatory Visit: Payer: Self-pay

## 2019-04-24 ENCOUNTER — Ambulatory Visit (HOSPITAL_COMMUNITY)
Admission: RE | Admit: 2019-04-24 | Discharge: 2019-04-24 | Disposition: A | Discharge status: Home / Self Care | Payer: Medicare Other | Source: Ambulatory Visit | Attending: Surgery | Admitting: Surgery

## 2019-04-24 DIAGNOSIS — E669 Obesity, unspecified: Secondary | ICD-10-CM

## 2019-05-02 ENCOUNTER — Ambulatory Visit (INDEPENDENT_AMBULATORY_CARE_PROVIDER_SITE_OTHER): Payer: Medicare Other | Admitting: Psychiatry

## 2019-05-02 DIAGNOSIS — F509 Eating disorder, unspecified: Secondary | ICD-10-CM | POA: Diagnosis not present

## 2019-05-15 ENCOUNTER — Ambulatory Visit: Payer: Self-pay | Admitting: Surgery

## 2019-05-15 DIAGNOSIS — E109 Type 1 diabetes mellitus without complications: Secondary | ICD-10-CM | POA: Diagnosis not present

## 2019-05-15 DIAGNOSIS — E039 Hypothyroidism, unspecified: Secondary | ICD-10-CM | POA: Diagnosis not present

## 2019-05-15 DIAGNOSIS — G4733 Obstructive sleep apnea (adult) (pediatric): Secondary | ICD-10-CM | POA: Diagnosis not present

## 2019-05-15 DIAGNOSIS — E669 Obesity, unspecified: Secondary | ICD-10-CM | POA: Diagnosis not present

## 2019-05-15 NOTE — H&P (Signed)
Surgical H&P  CC: obesity  HPI: following up today for management of morbid obesity.  I consulted with her just over a year ago and at that time we planned to initiate the bariatric pathway. She reports no major changes in her health and she remains eager to proceed with surgical treatment of her obesity.  She has consulted with the dietitian and psychologist and has been approved from their standpoint to be an excellent candidate for sleeve gastrectomy. Chest x-ray and upper GI were normal, no hiatal hernia. Reportedly has had labs including a hemoglobin A1c, B12, iron, pregnancy test, thyroxine, urinalysis, H. Pylori folic acid through her primary care doctor.  She reports stress related to the pandemic. 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 Reports school from home has been a disaster.   Initial viait 04/06/18: This is a very pleasant and relatively fit 73 year old woman who is here to discuss bariatric surgery. She has been struggling with her weight since her children were born many years ago. She has tried different types of exercises and numerous diets, most recently the keto diet last year which did afford about a 15 pound weight loss but was pretty much unbearable in terms of side effects. 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 She feels depressed about her weight. She has type 1 diabetes which was diagnosed late in life, age 56. Her daughter was diagnosed at age 56 and ultimately has undergone a sleeve gastrectomy followed by kidney pancreas transplant with great results. She also has a history of hypothyroidism and sleep apnea. States that she has no known heart or lung disease.  No tobacco drug or alcohol use.  The patient is a 73 year old female who presents for a bariatric surgery evaluation.Associated  symptoms include depressed mood, lost range of motion and joint pains. Initial onset of obesity was after pregnancy. Initial presentation included inability to lose weight. Disease complications include diabetes (type 1, late onset at age 89) and sleep apnea. Current diet includes well balanced meals. less than once per week. Pertinent family history includes obesity, diabetes and endocrine disorder. The patient is currently able to do activities of daily living without limitations, able to work with limitations and able to do housework without limitations. Surgical history: C-section - elective. Cardiac history: The patient denies smoking. Gastrointestinal History: Patient denies heartburn, dysphagia or irritable bowel disease. MBSQIP recognized comorbidities: Patient has diabetes mellitus and sleep apnea.  No Known Allergies  Past Medical History:  Diagnosis Date  . Diabetes mellitus without complication (HCC)     PSH: multiple c-sections  Family History  Problem Relation Age of Onset  . Cancer Other   . Diabetes Other     Social History   Socioeconomic History  . Marital status: Single    Spouse name: Not on file  . Number of children: Not on file  . Years of education: Not on file  . Highest education level: Not on file  Occupational History  . Not on file  Social Needs  . Financial resource strain: Not on file  . Food insecurity    Worry: Not on file    Inability: Not on file  . Transportation needs    Medical: Not on file    Non-medical: Not on file  Tobacco Use  . Smoking status: Never Smoker  .  Smokeless tobacco: Never Used  Substance and Sexual Activity  . Alcohol use: Yes    Comment: occasionally  . Drug use: Not on file  . Sexual activity: Not on file  Lifestyle  . Physical activity    Days per week: Not on file    Minutes per session: Not on file  . Stress: Not on file  Relationships  . Social Herbalist on phone: Not on file    Gets together:  Not on file    Attends religious service: Not on file    Active member of club or organization: Not on file    Attends meetings of clubs or organizations: Not on file    Relationship status: Not on file  Other Topics Concern  . Not on file  Social History Narrative  . Not on file    Current Outpatient Medications on File Prior to Visit  Medication Sig Dispense Refill  . DULoxetine (CYMBALTA) 60 MG capsule Take 60 mg by mouth daily.    Marland Kitchen FIASP FLEXTOUCH 100 UNIT/ML SOPN     . insulin degludec (TRESIBA FLEXTOUCH) 100 UNIT/ML SOPN FlexTouch Pen Inject 28 Units into the skin daily at 10 pm.    . insulin glargine (LANTUS) 100 UNIT/ML injection Inject 26 Units into the skin daily.    . insulin lispro (HUMALOG) 100 UNIT/ML injection Inject into the skin 3 (three) times daily before meals. Sliding scale @@ 1 unit / 5 grams carb and 1 unit / 20 mg/dl above target of 100 mg/dl    . levothyroxine (SYNTHROID) 125 MCG tablet Take 125 mcg by mouth daily before breakfast.    . polyethylene glycol-electrolytes (NULYTELY/GOLYTELY) 420 g solution U UTD  0  . pravastatin (PRAVACHOL) 20 MG tablet Take 20 mg by mouth daily.    Marland Kitchen SYNTHROID 112 MCG tablet TK 1 T PO QD IN THE MORNING OES  4   No current facility-administered medications on file prior to visit.     Review of Systems: a complete, 10pt review of systems was completed with pertinent positives and negatives as documented in the HPI  Physical Exam: Vitals (Brown; 05/15/2019 9:22 AM) 05/15/2019 9:21 AM Weight: 271 lb Height: 63in Neck: 16in Body Surface Area: 2.2 m Body Mass Index: 48.01 kg/m  Temp.: 96.34F(Temporal)  Pulse: 98 (Regular)  Resp.: 16 (Unlabored)  P.OX: 93% (Room air) BP: 152/74 (Sitting, Left Arm, Standard)  Alert and cooperative. Extraocular motions intact, no scleral icterus Speech is normal Respirations are even and unlabored No lower extremity edema   No flowsheet data found.  No  flowsheet data found.  No results found for: INR, PROTIME  Imaging: No results found.   A/P: Morbid obesity with comorbidities of arthritis, back pain, depression, type 1 diabetes, hypercholesterolemia, migraines, obstructive sleep apnea, hypothyroid. Remains an appropriate candidate for sleeve gastrectomy. Although she does have type 1 diabetes, given her age I think that a sleeve gastrectomy would be the better option as it will have a good effect on her insulin resistance as well. We again discussed the surgery including technical aspects, the risks of bleeding, infection, pain, scarring, injury to intra-abdominal structures, staple line leak or abscess, chronic abdominal pain or nausea, new onset or worsened GERD, DVT/PE, pneumonia, heart attack, stroke, death, failure to reach weight loss goals and weight regain, hernia. Discussed the typical peri- and postoperative course. Discussed the importance of lifelong behavioral changes to combat the chronic and relapsing disease which is obesity. She has  done a good amount of research and reading reveals prepared to go forward. Questions were answered. Plan to proceed pending insurance authorization.  Romana Juniper, MD [facility], [place] Pager [phone]

## 2019-05-20 DIAGNOSIS — Z794 Long term (current) use of insulin: Secondary | ICD-10-CM | POA: Diagnosis not present

## 2019-05-20 DIAGNOSIS — E1065 Type 1 diabetes mellitus with hyperglycemia: Secondary | ICD-10-CM | POA: Diagnosis not present

## 2019-05-20 DIAGNOSIS — E039 Hypothyroidism, unspecified: Secondary | ICD-10-CM | POA: Diagnosis not present

## 2019-05-28 ENCOUNTER — Telehealth: Payer: Self-pay | Admitting: Dietician

## 2019-05-28 NOTE — Telephone Encounter (Signed)
I attempted to call patient to assess their understanding of the pre-op nutrition recommendations to ensure readiness for upcoming surgery on 06/25/2019.  Patient was unavailable, I LVM asking them to return the call.    [facility]) Short, MS, RD, LDN

## 2019-05-29 ENCOUNTER — Telehealth: Payer: Self-pay | Admitting: Dietician

## 2019-05-29 NOTE — Telephone Encounter (Signed)
I attempted to call patient to assess their understanding of the pre-op nutrition recommendations to ensure readiness for upcoming surgery on 06/25/2019.  Patient was unavailable, I LVM asking them to return the call.    Christine Mcknight) Vonzell Lindblad, MS, RD, LDN

## 2019-06-10 ENCOUNTER — Encounter: Payer: Medicare Other | Attending: Surgery | Admitting: Skilled Nursing Facility1

## 2019-06-10 ENCOUNTER — Ambulatory Visit: Payer: Medicare Other

## 2019-06-10 ENCOUNTER — Other Ambulatory Visit: Payer: Self-pay

## 2019-06-10 DIAGNOSIS — E108 Type 1 diabetes mellitus with unspecified complications: Secondary | ICD-10-CM | POA: Insufficient documentation

## 2019-06-10 DIAGNOSIS — E669 Obesity, unspecified: Secondary | ICD-10-CM | POA: Diagnosis not present

## 2019-06-11 NOTE — Progress Notes (Signed)
Pre-Operative Nutrition Class:  Appt start time: 1030   End time:  1830.  Patient was seen on 06/10/2019 for Pre-Operative Bariatric Surgery Education [facility].   Surgery date:  Surgery type: sleeve [facility] weight at [facility]: 262.2 Weight today: 270.8   The following the learning objectives were met by the patient during this course:  Identify Pre-Op Dietary Goals and will begin 2 weeks pre-operatively  Identify appropriate sources of fluids and proteins   State protein recommendations and appropriate sources pre and post-operatively  Identify Post-Operative Dietary Goals and will follow for 2 weeks post-operatively  Identify appropriate multivitamin and calcium sources  Describe the need for physical activity post-operatively and will follow MD recommendations  State when to call healthcare provider regarding medication questions or post-operative complications  Handouts given during class include:  Pre-Op Bariatric Surgery Diet Handout  Protein Shake Handout  Post-Op Bariatric Surgery Nutrition Handout  [facility]EL[facility] Program Information Flyer  Support Group Information Flyer  [facility] Bariatric Supplements Price List  Follow-Up Plan: Patient will follow-up at [facility] 2 weeks post operatively for diet advancement per MD.

## 2019-06-12 DIAGNOSIS — E559 Vitamin D deficiency, unspecified: Secondary | ICD-10-CM | POA: Diagnosis not present

## 2019-06-12 DIAGNOSIS — E7800 Pure hypercholesterolemia, unspecified: Secondary | ICD-10-CM | POA: Diagnosis not present

## 2019-06-12 DIAGNOSIS — E039 Hypothyroidism, unspecified: Secondary | ICD-10-CM | POA: Diagnosis not present

## 2019-06-17 ENCOUNTER — Encounter (HOSPITAL_COMMUNITY): Payer: Self-pay

## 2019-06-17 NOTE — Progress Notes (Signed)
PCP - Jonathon Jordan  lov 04-16-19 on chart Cardiologist -    endocrinologist Dr. Buddy Duty Chest x-ray - 04-24-19 epic EKG - 04-24-19 epic Stress Test -  ECHO -  Cardiac Cath -   Sleep Study -  CPAP - yes  Fasting Blood Sugar - 120-230 Checks Blood Sugar __continous dexcon glucose monitor___  TYPE 1  DM   Blood Thinner Instructions: Aspirin Instructions: Last Dose:  Anesthesia review: HGBa1c 9.0, 286 BS finger stick 330 on  CMP  Patient denies shortness of breath, fever, cough and chest pain at PAT appointment   none   Patient verbalized understanding of instructions that were given to them at the PAT appointment. Patient was also instructed that they will need to review over the PAT instructions again at home before surgery.

## 2019-06-17 NOTE — Patient Instructions (Addendum)
DUE TO COVID-19 ONLY ONE VISITOR IS ALLOWED TO COME WITH YOU AND STAY IN THE WAITING ROOM ONLY DURING PRE OP AND PROCEDURE DAY OF SURGERY. THE 1 VISITOR MAY VISIT WITH YOU AFTER SURGERY IN YOUR PRIVATE ROOM DURING VISITING HOURS ONLY!  YOU NEED TO HAVE A COVID 19 TEST ON_______ @_______ , THIS TEST MUST BE DONE BEFORE SURGERY, COME  [place] , [address].  ([place]) ONCE YOUR COVID TEST IS COMPLETED, PLEASE BEGIN THE QUARANTINE INSTRUCTIONS AS OUTLINED IN YOUR HANDOUT.                Hanalei Mockus  06/17/2019   Your procedure is scheduled on: 06-25-19   Report to [facility] Main  Entrance   Report to short stay  at    0530 AM     Call this number if you have problems the morning of surgery [phone]    Remember: MORNING OF SURGERY DRINK:   DRINK 1 G2 drink BEFORE YOU LEAVE HOME, DRINK ALL OF THE  G2 DRINK AT ONE TIME.   NO SOLID FOOD AFTER 600 PM THE NIGHT BEFORE YOUR SURGERY. YOU MAY DRINK CLEAR FLUIDS. THE G2 DRINK YOU DRINK BEFORE YOU LEAVE HOME WILL BE THE LAST FLUIDS YOU DRINK BEFORE SURGERY.  PAIN IS EXPECTED AFTER SURGERY AND WILL NOT BE COMPLETELY ELIMINATED. AMBULATION AND TYLENOL WILL HELP REDUCE INCISIONAL AND GAS PAIN. MOVEMENT IS KEY!  YOU ARE EXPECTED TO BE OUT OF BED WITHIN 4 HOURS OF ADMISSION TO YOUR PATIENT ROOM.  SITTING IN THE RECLINER THROUGHOUT THE DAY IS IMPORTANT FOR DRINKING FLUIDS AND MOVING GAS THROUGHOUT THE GI TRACT.  COMPRESSION STOCKINGS SHOULD BE WORN [place] UNLESS YOU ARE WALKING.   INCENTIVE SPIROMETER SHOULD BE USED EVERY HOUR WHILE AWAKE TO DECREASE POST-OPERATIVE COMPLICATIONS SUCH AS PNEUMONIA.  WHEN DISCHARGED HOME, IT IS IMPORTANT TO CONTINUE TO WALK EVERY HOUR AND USE THE INCENTIVE SPIROMETER EVERY HOUR.       CLEAR LIQUID DIET   Foods Allowed                                                                     Foods Excluded  Coffee and tea, regular and decaf                              liquids that you cannot  Plain Jell-O any favor except red or purple                                           see through such as: Fruit ices (not with fruit pulp)                                     milk, soups, orange juice  Iced Popsicles                                    All  solid food Carbonated beverages, regular and diet                                    Cranberry, grape and apple juices Sports drinks like Gatorade Lightly seasoned clear broth or consume(fat free) Sugar, honey syrup  Sample Menu Breakfast                                Lunch                                     Supper Cranberry juice                    Beef broth                            Chicken broth Jell-O                                     Grape juice                           Apple juice Coffee or tea                        Jell-O                                      Popsicle                                                Coffee or tea                        Coffee or tea  _____________________________________________________________________         .  BRUSH YOUR TEETH MORNING OF SURGERY AND RINSE YOUR MOUTH OUT, NO CHEWING GUM CANDY OR MINTS.     Take these medicines the morning of surgery with A SIP OF WATER: prevastatin, levothyroxine, cymbalta                                 You may not have any metal on your body including hair pins and              piercings  Do not wear jewelry, make-up, lotions, powders or perfumes, deodorant             Do not wear nail polish.  Do not shave  48 hours prior to surgery.     Do not bring valuables to the hospital. [place].  Contacts, dentures or bridgework may not be worn into surgery.                 Please read  over the following fact sheets you were given: _____________________________________________________________________ How to Manage Your Diabetes Before and After  Surgery  Why is it important to control my blood sugar before and after surgery? . Improving blood sugar levels before and after surgery helps healing and can limit problems. . A way of improving blood sugar control is eating a healthy diet by: o  Eating less sugar and carbohydrates o  Increasing activity/exercise o  Talking with your doctor about reaching your blood sugar goals . High blood sugars (greater than 180 mg/dL) can raise your risk of infections and slow your recovery, so you will need to focus on controlling your diabetes during the weeks before surgery. . Make sure that the doctor who takes care of your diabetes knows about your planned surgery including the date and location.  How do I manage my blood sugar before surgery? . Check your blood sugar at least 4 times a day, starting 2 days before surgery, to make sure that the level is not too high or low. o Check your blood sugar the morning of your surgery when you wake up and every 2 hours until you get to the Short Stay unit. . If your blood sugar is less than 70 mg/dL, you will need to treat for low blood sugar: o Do not take insulin. o Treat a low blood sugar (less than 70 mg/dL) with  cup of clear juice (cranberry or apple), 4 glucose tablets, OR glucose gel. o Recheck blood sugar in 15 minutes after treatment (to make sure it is greater than 70 mg/dL). If your blood sugar is not greater than 70 mg/dL on recheck, call [phone] for further instructions. . Report your blood sugar to the short stay nurse when you get to Short Stay.  . If you are admitted to the hospital after surgery: o Your blood sugar will be checked by the staff and you will probably be given insulin after surgery (instead of oral diabetes medicines) to make sure you have good blood sugar levels. o The goal for blood sugar control after surgery is 80-180 mg/dL.   WHAT DO I DO ABOUT MY DIABETES MEDICATION?  Marland Kitchen Do not take oral diabetes medicines  (pills) the morning of surgery.  . THE   DAY  BEFORE SURGERY, take all of Tresbia  AM dose          Take Fiasp as usual day before surgery none at bedtime        If BS > 220 follow instructions below for day of surgery                                                    . THE MORNING OF SURGERY,  Type 1 diabetics take 80 % of Tresbia insulin     . The day of surgery, do not take other diabetes injectables, including Byetta (exenatide), Bydureon (exenatide ER), Victoza (liraglutide), or Trulicity (dulaglutide).  . If your CBG is greater than 220 mg/dL, you may take  of your sliding scale  . (correction) dose of insulin.              [facility] - Preparing for Surgery Before surgery, you can play an important role.  Because skin is not sterile, your skin needs to be as free of germs as possible.  You can  reduce the number of germs on your skin by washing with CHG (chlorahexidine gluconate) soap before surgery.  CHG is an antiseptic cleaner which kills germs and bonds with the skin to continue killing germs even after washing. Please DO NOT use if you have an allergy to CHG or antibacterial soaps.  If your skin becomes reddened/irritated stop using the CHG and inform your nurse when you arrive at Short Stay. Do not shave (including legs and underarms) for at least 48 hours prior to the first CHG shower.  You may shave your face/neck. Please follow these instructions carefully:  1.  Shower with CHG Soap the night before surgery and the  morning of Surgery.  2.  If you choose to wash your hair, wash your hair first as usual with your  normal  shampoo.  3.  After you shampoo, rinse your hair and body thoroughly to remove the  shampoo.                           4.  Use CHG as you would any other liquid soap.  You can apply chg directly  to the skin and wash                       Gently with a scrungie or clean washcloth.  5.  Apply the CHG Soap to your body ONLY FROM THE NECK DOWN.   Do not use on  face/ open                           Wound or open sores. Avoid contact with eyes, ears mouth and genitals (private parts).                       Wash face,  Genitals (private parts) with your normal soap.             6.  Wash thoroughly, paying special attention to the area where your surgery  will be performed.  7.  Thoroughly rinse your body with warm water from the neck down.  8.  DO NOT shower/wash with your normal soap after using and rinsing off  the CHG Soap.                9.  Pat yourself dry with a clean towel.            10.  Wear clean pajamas.            11.  Place clean sheets on your bed the night of your first shower and do not  sleep with pets. Day of Surgery : Do not apply any lotions/deodorants the morning of surgery.  Please wear clean clothes to the hospital/surgery center.  FAILURE TO FOLLOW THESE INSTRUCTIONS MAY RESULT IN THE CANCELLATION OF YOUR SURGERY PATIENT SIGNATURE_________________________________  NURSE SIGNATURE__________________________________  ________________________________________________________________________   Adam Phenix  An incentive spirometer is a tool that can help keep your lungs clear and active. This tool measures how well you are filling your lungs with each breath. Taking long deep breaths may help reverse or decrease the chance of developing breathing (pulmonary) problems (especially infection) following:  A long period of time when you are unable to move or be active. BEFORE THE PROCEDURE   If the spirometer includes an indicator to show your best effort, your nurse or respiratory therapist will set it to  a desired goal.  If possible, sit up straight or lean slightly forward. Try not to slouch.  Hold the incentive spirometer in an upright position. INSTRUCTIONS FOR USE  1. Sit on the edge of your bed if possible, or sit up as far as you can in bed or on a chair. 2. Hold the incentive spirometer in an upright  position. 3. Breathe out normally. 4. Place the mouthpiece in your mouth and seal your lips tightly around it. 5. Breathe in slowly and as deeply as possible, raising the piston or the ball toward the top of the column. 6. Hold your breath for 3-5 seconds or for as long as possible. Allow the piston or ball to fall to the bottom of the column. 7. Remove the mouthpiece from your mouth and breathe out normally. 8. Rest for a few seconds and repeat Steps 1 through 7 at least 10 times every 1-2 hours when you are awake. Take your time and take a few normal breaths between deep breaths. 9. The spirometer may include an indicator to show your best effort. Use the indicator as a goal to work toward during each repetition. 10. After each set of 10 deep breaths, practice coughing to be sure your lungs are clear. If you have an incision (the cut made at the time of surgery), support your incision when coughing by placing a pillow or rolled up towels firmly against it. Once you are able to get out of bed, walk around indoors and cough well. You may stop using the incentive spirometer when instructed by your caregiver.  RISKS AND COMPLICATIONS  Take your time so you do not get dizzy or light-headed.  If you are in pain, you may need to take or ask for pain medication before doing incentive spirometry. It is harder to take a deep breath if you are having pain. AFTER USE  Rest and breathe slowly and easily.  It can be helpful to keep track of a log of your progress. Your caregiver can provide you with a simple table to help with this. If you are using the spirometer at home, follow these instructions: [place] IF:   You are having difficultly using the spirometer.  You have trouble using the spirometer as often as instructed.  Your pain medication is not giving enough relief while using the spirometer.  You develop fever of 100.5 F (38.1 C) or higher. SEEK IMMEDIATE MEDICAL CARE IF:    You cough up bloody sputum that had not been present before.  You develop fever of 102 F (38.9 C) or greater.  You develop worsening pain at or near the incision site. MAKE SURE YOU:   Understand these instructions.  Will watch your condition.  Will get help right away if you are not doing well or get worse. Document Released: 01/23/2007 Document Revised: 12/05/2011 Document Reviewed: 03/26/2007 [facility] Patient Information 2014 [employer], [place].   ____turn cough and deep breathe after surgery____________________________________________________________________

## 2019-06-20 ENCOUNTER — Encounter (HOSPITAL_COMMUNITY): Payer: Self-pay

## 2019-06-20 ENCOUNTER — Other Ambulatory Visit: Payer: Self-pay

## 2019-06-20 ENCOUNTER — Encounter (HOSPITAL_COMMUNITY)
Admission: RE | Admit: 2019-06-20 | Discharge: 2019-06-20 | Disposition: A | Discharge status: Home / Self Care | Payer: Medicare Other | Source: Ambulatory Visit | Attending: Surgery | Admitting: Surgery

## 2019-06-20 DIAGNOSIS — Z01812 Encounter for preprocedural laboratory examination: Secondary | ICD-10-CM | POA: Insufficient documentation

## 2019-06-20 HISTORY — DX: Depression, unspecified: F32.A

## 2019-06-20 HISTORY — DX: Sleep apnea, unspecified: G47.30

## 2019-06-20 HISTORY — DX: Other complications of anesthesia, initial encounter: T88.59XA

## 2019-06-20 HISTORY — DX: Anxiety disorder, unspecified: F41.9

## 2019-06-20 HISTORY — DX: Nausea with vomiting, unspecified: R11.2

## 2019-06-20 HISTORY — DX: Hypothyroidism, unspecified: E03.9

## 2019-06-20 HISTORY — DX: Unspecified osteoarthritis, unspecified site: M19.90

## 2019-06-20 HISTORY — DX: Headache, unspecified: R51.9

## 2019-06-20 HISTORY — DX: Fibromyalgia: M79.7

## 2019-06-20 HISTORY — DX: Essential (primary) hypertension: I10

## 2019-06-20 HISTORY — DX: Other specified postprocedural states: Z98.890

## 2019-06-20 LAB — COMPREHENSIVE METABOLIC PANEL
ALT: 15 U/L (ref 0–44)
AST: 16 U/L (ref 15–41)
Albumin: 3.7 g/dL (ref 3.5–5.0)
Alkaline Phosphatase: 106 U/L (ref 38–126)
Anion gap: 7 (ref 5–15)
BUN: 21 mg/dL (ref 8–23)
CO2: 27 mmol/L (ref 22–32)
Calcium: 9.2 mg/dL (ref 8.9–10.3)
Chloride: 103 mmol/L (ref 98–111)
Creatinine, Ser: 0.98 mg/dL (ref 0.44–1.00)
GFR calc Af Amer: >60 mL/min (ref >60)
GFR calc non Af Amer: 58 mL/min — ABNORMAL LOW (ref >60)
Glucose, Bld: 330 mg/dL — ABNORMAL HIGH (ref 70–99)
Potassium: 4.9 mmol/L (ref 3.5–5.1)
Sodium: 137 mmol/L (ref 135–145)
Total Bilirubin: 0.7 mg/dL (ref 0.3–1.2)
Total Protein: 7.3 g/dL (ref 6.5–8.1)

## 2019-06-20 LAB — CBC WITH DIFFERENTIAL/PLATELET
Abs Immature Granulocytes: 0.02 10*3/uL (ref 0.00–0.07)
Basophils Absolute: 0 10*3/uL (ref 0.0–0.1)
Basophils Relative: 1 %
Eosinophils Absolute: 0.2 10*3/uL (ref 0.0–0.5)
Eosinophils Relative: 3 %
HCT: 35.6 % — ABNORMAL LOW (ref 36.0–46.0)
Hemoglobin: 11.6 g/dL — ABNORMAL LOW (ref 12.0–15.0)
Immature Granulocytes: 0 %
Lymphocytes Relative: 27 %
Lymphs Abs: 1.9 10*3/uL (ref 0.7–4.0)
MCH: 29.7 pg (ref 26.0–34.0)
MCHC: 32.6 g/dL (ref 30.0–36.0)
MCV: 91.3 fL (ref 80.0–100.0)
Monocytes Absolute: 0.7 10*3/uL (ref 0.1–1.0)
Monocytes Relative: 11 %
Neutro Abs: 4 10*3/uL (ref 1.7–7.7)
Neutrophils Relative %: 58 %
Platelets: 329 10*3/uL (ref 150–400)
RBC: 3.9 MIL/uL (ref 3.87–5.11)
RDW: 12.3 % (ref 11.5–15.5)
WBC: 6.8 10*3/uL (ref 4.0–10.5)
nRBC: 0 % (ref 0.0–0.2)

## 2019-06-20 LAB — GLUCOSE, CAPILLARY: Glucose-Capillary: 286 mg/dL — ABNORMAL HIGH (ref 70–99)

## 2019-06-20 LAB — HEMOGLOBIN A1C
Hgb A1c MFr Bld: 9 % — ABNORMAL HIGH (ref 4.8–5.6)
Mean Plasma Glucose: 211.6 mg/dL

## 2019-06-20 LAB — ABO/RH: ABO/RH(D): A POS

## 2019-06-21 ENCOUNTER — Other Ambulatory Visit (HOSPITAL_COMMUNITY)
Admission: RE | Admit: 2019-06-21 | Discharge: 2019-06-21 | Disposition: A | Discharge status: Home / Self Care | Payer: Medicare Other | Source: Ambulatory Visit | Attending: Surgery | Admitting: Surgery

## 2019-06-21 DIAGNOSIS — Z01812 Encounter for preprocedural laboratory examination: Secondary | ICD-10-CM | POA: Insufficient documentation

## 2019-06-21 DIAGNOSIS — Z20828 Contact with and (suspected) exposure to other viral communicable diseases: Secondary | ICD-10-CM | POA: Diagnosis not present

## 2019-06-22 LAB — NOVEL CORONAVIRUS, NAA (HOSP ORDER, SEND-OUT TO REF LAB; TAT 18-24 HRS): SARS-CoV-2, NAA: NOT DETECTED

## 2019-06-24 MED ORDER — BUPIVACAINE LIPOSOME 1.3 % IJ SUSP
20.0000 mL | Freq: Once | INTRAMUSCULAR | Status: DC
  Filled 2019-06-24: qty 20

## 2019-06-25 ENCOUNTER — Encounter (HOSPITAL_COMMUNITY): Payer: Self-pay | Admitting: Anesthesiology

## 2019-06-25 ENCOUNTER — Inpatient Hospital Stay (HOSPITAL_COMMUNITY): Payer: Medicare Other | Admitting: Physician Assistant

## 2019-06-25 ENCOUNTER — Other Ambulatory Visit: Payer: Self-pay

## 2019-06-25 ENCOUNTER — Encounter (HOSPITAL_COMMUNITY)
Admission: RE | Disposition: A | Discharge status: Home / Self Care | Payer: Self-pay | Source: Home / Self Care | Attending: Surgery

## 2019-06-25 ENCOUNTER — Inpatient Hospital Stay (HOSPITAL_COMMUNITY): Payer: Medicare Other | Admitting: Anesthesiology

## 2019-06-25 ENCOUNTER — Inpatient Hospital Stay (HOSPITAL_COMMUNITY)
Admission: RE | Admit: 2019-06-25 | Discharge: 2019-06-26 | DRG: 621 | Disposition: A | Discharge status: Home / Self Care | Payer: Medicare Other | Attending: Surgery | Admitting: Surgery

## 2019-06-25 DIAGNOSIS — Z79899 Other long term (current) drug therapy: Secondary | ICD-10-CM

## 2019-06-25 DIAGNOSIS — Z20828 Contact with and (suspected) exposure to other viral communicable diseases: Secondary | ICD-10-CM | POA: Diagnosis present

## 2019-06-25 DIAGNOSIS — Z794 Long term (current) use of insulin: Secondary | ICD-10-CM | POA: Diagnosis not present

## 2019-06-25 DIAGNOSIS — Z23 Encounter for immunization: Secondary | ICD-10-CM

## 2019-06-25 DIAGNOSIS — Z6841 Body Mass Index (BMI) 40.0 and over, adult: Secondary | ICD-10-CM

## 2019-06-25 DIAGNOSIS — E109 Type 1 diabetes mellitus without complications: Secondary | ICD-10-CM | POA: Diagnosis present

## 2019-06-25 DIAGNOSIS — K449 Diaphragmatic hernia without obstruction or gangrene: Secondary | ICD-10-CM | POA: Diagnosis not present

## 2019-06-25 DIAGNOSIS — M797 Fibromyalgia: Secondary | ICD-10-CM | POA: Diagnosis present

## 2019-06-25 DIAGNOSIS — F419 Anxiety disorder, unspecified: Secondary | ICD-10-CM | POA: Diagnosis present

## 2019-06-25 DIAGNOSIS — Z79890 Hormone replacement therapy (postmenopausal): Secondary | ICD-10-CM

## 2019-06-25 DIAGNOSIS — G4730 Sleep apnea, unspecified: Secondary | ICD-10-CM | POA: Diagnosis not present

## 2019-06-25 DIAGNOSIS — I10 Essential (primary) hypertension: Secondary | ICD-10-CM | POA: Diagnosis not present

## 2019-06-25 DIAGNOSIS — F329 Major depressive disorder, single episode, unspecified: Secondary | ICD-10-CM | POA: Diagnosis not present

## 2019-06-25 DIAGNOSIS — E039 Hypothyroidism, unspecified: Secondary | ICD-10-CM | POA: Diagnosis not present

## 2019-06-25 DIAGNOSIS — Z833 Family history of diabetes mellitus: Secondary | ICD-10-CM | POA: Diagnosis not present

## 2019-06-25 DIAGNOSIS — E119 Type 2 diabetes mellitus without complications: Secondary | ICD-10-CM | POA: Diagnosis not present

## 2019-06-25 HISTORY — PX: LAPAROSCOPIC GASTRIC SLEEVE RESECTION: SHX5895

## 2019-06-25 LAB — GLUCOSE, CAPILLARY
Glucose-Capillary: 124 mg/dL — ABNORMAL HIGH (ref 70–99)
Glucose-Capillary: 139 mg/dL — ABNORMAL HIGH (ref 70–99)
Glucose-Capillary: 158 mg/dL — ABNORMAL HIGH (ref 70–99)
Glucose-Capillary: 172 mg/dL — ABNORMAL HIGH (ref 70–99)
Glucose-Capillary: 175 mg/dL — ABNORMAL HIGH (ref 70–99)
Glucose-Capillary: 188 mg/dL — ABNORMAL HIGH (ref 70–99)
Glucose-Capillary: 204 mg/dL — ABNORMAL HIGH (ref 70–99)
Glucose-Capillary: 212 mg/dL — ABNORMAL HIGH (ref 70–99)

## 2019-06-25 LAB — TYPE AND SCREEN
ABO/RH(D): A POS
Antibody Screen: NEGATIVE

## 2019-06-25 SURGERY — GASTRECTOMY, SLEEVE, LAPAROSCOPIC
Anesthesia: General | Site: Abdomen

## 2019-06-25 MED ORDER — ENOXAPARIN SODIUM 30 MG/0.3ML ~~LOC~~ SOLN
30.0000 mg | Freq: Two times a day (BID) | SUBCUTANEOUS | Status: DC
  Administered 2019-06-25 – 2019-06-26 (×2): 30 mg via SUBCUTANEOUS
  Filled 2019-06-25 (×2): qty 0.3

## 2019-06-25 MED ORDER — METHOCARBAMOL 500 MG IVPB - SIMPLE MED
500.0000 mg | Freq: Four times a day (QID) | INTRAVENOUS | Status: DC | PRN
  Administered 2019-06-25: 500 mg via INTRAVENOUS
  Filled 2019-06-25: qty 50

## 2019-06-25 MED ORDER — ONDANSETRON HCL 4 MG/2ML IJ SOLN
4.0000 mg | INTRAMUSCULAR | Status: DC | PRN
  Administered 2019-06-25: 4 mg via INTRAVENOUS
  Filled 2019-06-25: qty 2

## 2019-06-25 MED ORDER — ONDANSETRON HCL 4 MG/2ML IJ SOLN
INTRAMUSCULAR | Status: AC
  Administered 2019-06-25: 4 mg via INTRAVENOUS
  Filled 2019-06-25: qty 2

## 2019-06-25 MED ORDER — HYDROMORPHONE HCL 1 MG/ML IJ SOLN
INTRAMUSCULAR | Status: AC
  Administered 2019-06-25: 0.25 mg via INTRAVENOUS
  Filled 2019-06-25: qty 1

## 2019-06-25 MED ORDER — ACETAMINOPHEN 160 MG/5ML PO SOLN
1000.0000 mg | Freq: Three times a day (TID) | ORAL | Status: DC
  Administered 2019-06-25 – 2019-06-26 (×2): 1000 mg via ORAL
  Filled 2019-06-25 (×2): qty 40.6

## 2019-06-25 MED ORDER — BUPIVACAINE HCL (PF) 0.25 % IJ SOLN
INTRAMUSCULAR | Status: DC | PRN
  Administered 2019-06-25: 30 mL

## 2019-06-25 MED ORDER — TRAMADOL HCL 50 MG PO TABS: 50.0000 mg | ORAL_TABLET | Freq: Four times a day (QID) | ORAL | Status: DC | PRN

## 2019-06-25 MED ORDER — ONDANSETRON HCL 4 MG/2ML IJ SOLN
INTRAMUSCULAR | Status: AC
  Filled 2019-06-25: qty 4

## 2019-06-25 MED ORDER — DULOXETINE HCL 60 MG PO CPEP
60.0000 mg | ORAL_CAPSULE | Freq: Every day | ORAL | Status: DC
  Administered 2019-06-25 – 2019-06-26 (×2): 60 mg via ORAL
  Filled 2019-06-25 (×2): qty 1

## 2019-06-25 MED ORDER — PHENYLEPHRINE 40 MCG/ML (10ML) SYRINGE FOR IV PUSH (FOR BLOOD PRESSURE SUPPORT)
PREFILLED_SYRINGE | INTRAVENOUS | Status: DC | PRN
  Administered 2019-06-25 (×2): 80 ug via INTRAVENOUS

## 2019-06-25 MED ORDER — ONDANSETRON HCL 4 MG/2ML IJ SOLN
INTRAMUSCULAR | Status: DC | PRN
  Administered 2019-06-25: 4 mg via INTRAVENOUS

## 2019-06-25 MED ORDER — BUPIVACAINE HCL (PF) 0.25 % IJ SOLN
INTRAMUSCULAR | Status: AC
  Filled 2019-06-25: qty 30

## 2019-06-25 MED ORDER — CHLORHEXIDINE GLUCONATE 4 % EX LIQD: 60.0000 mL | Freq: Once | CUTANEOUS | Status: DC

## 2019-06-25 MED ORDER — LACTATED RINGERS IR SOLN
Status: DC | PRN
  Administered 2019-06-25: 1000 mL

## 2019-06-25 MED ORDER — SUGAMMADEX SODIUM 500 MG/5ML IV SOLN
INTRAVENOUS | Status: AC
  Filled 2019-06-25: qty 10

## 2019-06-25 MED ORDER — INSULIN ASPART 100 UNIT/ML ~~LOC~~ SOLN
SUBCUTANEOUS | Status: AC
  Filled 2019-06-25: qty 1

## 2019-06-25 MED ORDER — PROPOFOL 10 MG/ML IV BOLUS
INTRAVENOUS | Status: DC | PRN
  Administered 2019-06-25: 180 mg via INTRAVENOUS

## 2019-06-25 MED ORDER — MIDAZOLAM HCL 5 MG/5ML IJ SOLN
INTRAMUSCULAR | Status: DC | PRN
  Administered 2019-06-25: 1 mg via INTRAVENOUS

## 2019-06-25 MED ORDER — ENSURE MAX PROTEIN PO LIQD
2.0000 [oz_av] | ORAL | Status: DC
  Administered 2019-06-26 (×2): 2 [oz_av] via ORAL

## 2019-06-25 MED ORDER — FENTANYL CITRATE (PF) 100 MCG/2ML IJ SOLN
INTRAMUSCULAR | Status: DC | PRN
  Administered 2019-06-25 (×2): 50 ug via INTRAVENOUS

## 2019-06-25 MED ORDER — METOCLOPRAMIDE HCL 5 MG/ML IJ SOLN
INTRAMUSCULAR | Status: AC
  Filled 2019-06-25: qty 2

## 2019-06-25 MED ORDER — HYDROMORPHONE HCL 1 MG/ML IJ SOLN: 0.5000 mg | INTRAMUSCULAR | Status: DC | PRN

## 2019-06-25 MED ORDER — ROCURONIUM BROMIDE 10 MG/ML (PF) SYRINGE
PREFILLED_SYRINGE | INTRAVENOUS | Status: AC
  Filled 2019-06-25: qty 10

## 2019-06-25 MED ORDER — LIDOCAINE 2% (20 MG/ML) 5 ML SYRINGE
INTRAMUSCULAR | Status: AC
  Filled 2019-06-25: qty 20

## 2019-06-25 MED ORDER — EPHEDRINE 5 MG/ML INJ
INTRAVENOUS | Status: AC
  Filled 2019-06-25: qty 10

## 2019-06-25 MED ORDER — LIDOCAINE 2% (20 MG/ML) 5 ML SYRINGE
INTRAMUSCULAR | Status: DC | PRN
  Administered 2019-06-25: 80 mg via INTRAVENOUS

## 2019-06-25 MED ORDER — ENOXAPARIN SODIUM 40 MG/0.4ML ~~LOC~~ SOLN
40.0000 mg | SUBCUTANEOUS | Status: AC
  Administered 2019-06-25: 40 mg via SUBCUTANEOUS
  Filled 2019-06-25: qty 0.4

## 2019-06-25 MED ORDER — GLYCOPYRROLATE PF 0.2 MG/ML IJ SOSY
PREFILLED_SYRINGE | INTRAMUSCULAR | Status: DC | PRN
  Administered 2019-06-25: .2 mg via INTRAVENOUS

## 2019-06-25 MED ORDER — HYDROMORPHONE HCL 1 MG/ML IJ SOLN
0.2500 mg | INTRAMUSCULAR | Status: DC | PRN
  Administered 2019-06-25 (×2): 0.25 mg via INTRAVENOUS

## 2019-06-25 MED ORDER — ROCURONIUM BROMIDE 10 MG/ML (PF) SYRINGE
PREFILLED_SYRINGE | INTRAVENOUS | Status: DC | PRN
  Administered 2019-06-25: 60 mg via INTRAVENOUS

## 2019-06-25 MED ORDER — DEXAMETHASONE SODIUM PHOSPHATE 10 MG/ML IJ SOLN
INTRAMUSCULAR | Status: DC | PRN
  Administered 2019-06-25: 4 mg via INTRAVENOUS

## 2019-06-25 MED ORDER — LIDOCAINE 2% (20 MG/ML) 5 ML SYRINGE
INTRAMUSCULAR | Status: DC | PRN
  Administered 2019-06-25: 1.5 mg/kg/h via INTRAVENOUS

## 2019-06-25 MED ORDER — FENTANYL CITRATE (PF) 100 MCG/2ML IJ SOLN
INTRAMUSCULAR | Status: AC
  Filled 2019-06-25: qty 2

## 2019-06-25 MED ORDER — STERILE WATER FOR IRRIGATION IR SOLN
Status: DC | PRN
  Administered 2019-06-25: 1000 mL

## 2019-06-25 MED ORDER — OXYCODONE HCL 5 MG PO TABS: 5.0000 mg | ORAL_TABLET | Freq: Once | ORAL | Status: DC | PRN

## 2019-06-25 MED ORDER — EPHEDRINE SULFATE-NACL 50-0.9 MG/10ML-% IV SOSY
PREFILLED_SYRINGE | INTRAVENOUS | Status: DC | PRN
  Administered 2019-06-25: 10 mg via INTRAVENOUS
  Administered 2019-06-25 (×2): 5 mg via INTRAVENOUS
  Administered 2019-06-25 (×7): 10 mg via INTRAVENOUS

## 2019-06-25 MED ORDER — INSULIN ASPART 100 UNIT/ML ~~LOC~~ SOLN
0.0000 [IU] | SUBCUTANEOUS | Status: DC
  Administered 2019-06-25: 3 [IU] via SUBCUTANEOUS
  Administered 2019-06-25 – 2019-06-26 (×3): 7 [IU] via SUBCUTANEOUS
  Administered 2019-06-26: 4 [IU] via SUBCUTANEOUS

## 2019-06-25 MED ORDER — PHENYLEPHRINE HCL (PRESSORS) 10 MG/ML IV SOLN
INTRAVENOUS | Status: DC | PRN
  Administered 2019-06-25: 80 ug via INTRAVENOUS

## 2019-06-25 MED ORDER — KETAMINE HCL 10 MG/ML IJ SOLN
INTRAMUSCULAR | Status: DC | PRN
  Administered 2019-06-25 (×2): 10 mg via INTRAVENOUS

## 2019-06-25 MED ORDER — APREPITANT 40 MG PO CAPS
40.0000 mg | ORAL_CAPSULE | ORAL | Status: AC
  Administered 2019-06-25: 40 mg via ORAL
  Filled 2019-06-25: qty 1

## 2019-06-25 MED ORDER — BUPIVACAINE LIPOSOME 1.3 % IJ SUSP
INTRAMUSCULAR | Status: DC | PRN
  Administered 2019-06-25: 20 mL

## 2019-06-25 MED ORDER — GABAPENTIN 300 MG PO CAPS
300.0000 mg | ORAL_CAPSULE | ORAL | Status: AC
  Administered 2019-06-25: 300 mg via ORAL
  Filled 2019-06-25: qty 1

## 2019-06-25 MED ORDER — LIDOCAINE 2% (20 MG/ML) 5 ML SYRINGE
INTRAMUSCULAR | Status: AC
  Filled 2019-06-25: qty 10

## 2019-06-25 MED ORDER — ONDANSETRON HCL 4 MG/2ML IJ SOLN
4.0000 mg | Freq: Once | INTRAMUSCULAR | Status: AC | PRN
  Administered 2019-06-25: 10:00:00 4 mg via INTRAVENOUS

## 2019-06-25 MED ORDER — SUGAMMADEX SODIUM 500 MG/5ML IV SOLN
INTRAVENOUS | Status: DC | PRN
  Administered 2019-06-25: 350 mg via INTRAVENOUS

## 2019-06-25 MED ORDER — 0.9 % SODIUM CHLORIDE (POUR BTL) OPTIME
TOPICAL | Status: DC | PRN
  Administered 2019-06-25: 1000 mL

## 2019-06-25 MED ORDER — SODIUM CHLORIDE 0.9 % IV SOLN
INTRAVENOUS | Status: DC
  Administered 2019-06-25 – 2019-06-26 (×3): via INTRAVENOUS

## 2019-06-25 MED ORDER — DEXAMETHASONE SODIUM PHOSPHATE 10 MG/ML IJ SOLN
INTRAMUSCULAR | Status: AC
  Filled 2019-06-25: qty 2

## 2019-06-25 MED ORDER — INSULIN ASPART 100 UNIT/ML ~~LOC~~ SOLN
SUBCUTANEOUS | Status: AC
  Administered 2019-06-25: 7 [IU] via SUBCUTANEOUS
  Filled 2019-06-25: qty 1

## 2019-06-25 MED ORDER — ACETAMINOPHEN 500 MG PO TABS
1000.0000 mg | ORAL_TABLET | ORAL | Status: AC
  Administered 2019-06-25: 1000 mg via ORAL
  Filled 2019-06-25: qty 2

## 2019-06-25 MED ORDER — SUCCINYLCHOLINE CHLORIDE 200 MG/10ML IV SOSY
PREFILLED_SYRINGE | INTRAVENOUS | Status: DC | PRN
  Administered 2019-06-25: 180 mg via INTRAVENOUS

## 2019-06-25 MED ORDER — PHENYLEPHRINE 40 MCG/ML (10ML) SYRINGE FOR IV PUSH (FOR BLOOD PRESSURE SUPPORT)
PREFILLED_SYRINGE | INTRAVENOUS | Status: AC
  Filled 2019-06-25: qty 10

## 2019-06-25 MED ORDER — METOPROLOL TARTRATE 5 MG/5ML IV SOLN: 5.0000 mg | Freq: Four times a day (QID) | INTRAVENOUS | Status: DC | PRN

## 2019-06-25 MED ORDER — LACTATED RINGERS IV SOLN: INTRAVENOUS | Status: DC

## 2019-06-25 MED ORDER — METHOCARBAMOL 500 MG IVPB - SIMPLE MED
INTRAVENOUS | Status: AC
  Filled 2019-06-25: qty 50

## 2019-06-25 MED ORDER — OXYCODONE HCL 5 MG/5ML PO SOLN: 5.0000 mg | Freq: Once | ORAL | Status: DC | PRN

## 2019-06-25 MED ORDER — ACETAMINOPHEN 500 MG PO TABS
1000.0000 mg | ORAL_TABLET | Freq: Three times a day (TID) | ORAL | Status: DC
  Administered 2019-06-26: 1000 mg via ORAL
  Filled 2019-06-25: qty 2

## 2019-06-25 MED ORDER — LACTATED RINGERS IV SOLN
INTRAVENOUS | Status: DC | PRN
  Administered 2019-06-25: 06:00:00 via INTRAVENOUS
  Administered 2019-06-25: 1000 mL via INTRAVENOUS

## 2019-06-25 MED ORDER — DOCUSATE SODIUM 100 MG PO CAPS
100.0000 mg | ORAL_CAPSULE | Freq: Two times a day (BID) | ORAL | Status: DC
  Administered 2019-06-25 – 2019-06-26 (×2): 100 mg via ORAL
  Filled 2019-06-25 (×2): qty 1

## 2019-06-25 MED ORDER — MIDAZOLAM HCL 2 MG/2ML IJ SOLN
INTRAMUSCULAR | Status: AC
  Filled 2019-06-25: qty 2

## 2019-06-25 MED ORDER — METOCLOPRAMIDE HCL 5 MG/ML IJ SOLN
10.0000 mg | Freq: Once | INTRAMUSCULAR | Status: AC
  Administered 2019-06-25: 10 mg via INTRAVENOUS

## 2019-06-25 MED ORDER — PANTOPRAZOLE SODIUM 40 MG IV SOLR
40.0000 mg | Freq: Every day | INTRAVENOUS | Status: DC
  Administered 2019-06-25: 40 mg via INTRAVENOUS
  Filled 2019-06-25: qty 40

## 2019-06-25 MED ORDER — SODIUM CHLORIDE 0.9 % IV SOLN
2.0000 g | INTRAVENOUS | Status: AC
  Administered 2019-06-25: 2 g via INTRAVENOUS
  Filled 2019-06-25: qty 2

## 2019-06-25 MED ORDER — ROCURONIUM BROMIDE 10 MG/ML (PF) SYRINGE
PREFILLED_SYRINGE | INTRAVENOUS | Status: AC
  Filled 2019-06-25: qty 20

## 2019-06-25 MED ORDER — HYDRALAZINE HCL 20 MG/ML IJ SOLN: 10.0000 mg | INTRAMUSCULAR | Status: DC | PRN

## 2019-06-25 MED ORDER — LEVOTHYROXINE SODIUM 25 MCG PO TABS
125.0000 ug | ORAL_TABLET | Freq: Every day | ORAL | Status: DC
  Administered 2019-06-26: 125 ug via ORAL
  Filled 2019-06-25: qty 1

## 2019-06-25 MED ORDER — SCOPOLAMINE 1 MG/3DAYS TD PT72
1.0000 | MEDICATED_PATCH | TRANSDERMAL | Status: DC
  Administered 2019-06-25: 1.5 mg via TRANSDERMAL
  Filled 2019-06-25: qty 1

## 2019-06-25 MED ORDER — GABAPENTIN 100 MG PO CAPS
200.0000 mg | ORAL_CAPSULE | Freq: Two times a day (BID) | ORAL | Status: DC
  Administered 2019-06-25: 200 mg via ORAL
  Filled 2019-06-25: qty 2

## 2019-06-25 MED ORDER — INFLUENZA VAC A&B SA ADJ QUAD 0.5 ML IM PRSY
0.5000 mL | PREFILLED_SYRINGE | INTRAMUSCULAR | Status: AC
  Administered 2019-06-26: 0.5 mL via INTRAMUSCULAR
  Filled 2019-06-25: qty 0.5

## 2019-06-25 MED ORDER — METOCLOPRAMIDE HCL 5 MG/ML IJ SOLN
10.0000 mg | Freq: Four times a day (QID) | INTRAMUSCULAR | Status: DC | PRN
  Administered 2019-06-25: 10 mg via INTRAVENOUS
  Filled 2019-06-25: qty 2

## 2019-06-25 MED ORDER — PROPOFOL 10 MG/ML IV BOLUS
INTRAVENOUS | Status: AC
  Filled 2019-06-25: qty 40

## 2019-06-25 MED ORDER — KETOROLAC TROMETHAMINE 15 MG/ML IJ SOLN
15.0000 mg | Freq: Three times a day (TID) | INTRAMUSCULAR | Status: DC | PRN
  Administered 2019-06-25: 15 mg via INTRAVENOUS
  Filled 2019-06-25: qty 1

## 2019-06-25 MED ORDER — KETAMINE HCL 10 MG/ML IJ SOLN
INTRAMUSCULAR | Status: AC
  Filled 2019-06-25: qty 1

## 2019-06-25 SURGICAL SUPPLY — 63 items
APPLICATOR COTTON TIP 6 STRL (MISCELLANEOUS) IMPLANT
APPLICATOR COTTON TIP 6IN STRL (MISCELLANEOUS)
APPLIER CLIP ROT 10 11.4 M/L (STAPLE)
APPLIER CLIP ROT 13.4 12 LRG (CLIP)
BAG LAPAROSCOPIC 12 15 PORT 16 (BASKET) ×1 IMPLANT
BAG RETRIEVAL 12/15 (BASKET) ×2
BENZOIN TINCTURE PRP APPL 2/3 (GAUZE/BANDAGES/DRESSINGS) ×2 IMPLANT
BLADE SURG SZ11 CARB STEEL (BLADE) ×2 IMPLANT
BNDG ADH 1X3 SHEER STRL LF (GAUZE/BANDAGES/DRESSINGS) ×2 IMPLANT
CABLE HIGH FREQUENCY MONO STRZ (ELECTRODE) IMPLANT
CHLORAPREP W/TINT 26 (MISCELLANEOUS) ×2 IMPLANT
CLIP APPLIE ROT 10 11.4 M/L (STAPLE) IMPLANT
CLIP APPLIE ROT 13.4 12 LRG (CLIP) IMPLANT
COVER SURGICAL LIGHT HANDLE (MISCELLANEOUS) ×2 IMPLANT
COVER WAND RF STERILE (DRAPES) IMPLANT
DECANTER SPIKE VIAL GLASS SM (MISCELLANEOUS) IMPLANT
DEVICE SUT QUICK LOAD TK 5 (STAPLE) ×2 IMPLANT
DEVICE SUT TI-KNOT TK 5X26 (MISCELLANEOUS) ×2 IMPLANT
DRAPE UTILITY XL STRL (DRAPES) ×4 IMPLANT
ELECT REM PT RETURN 15FT ADLT (MISCELLANEOUS) ×2 IMPLANT
GAUZE SPONGE 4X4 12PLY STRL (GAUZE/BANDAGES/DRESSINGS) IMPLANT
GLOVE BIO SURGEON STRL SZ 6 (GLOVE) ×2 IMPLANT
GLOVE INDICATOR 6.5 STRL GRN (GLOVE) ×2 IMPLANT
GOWN STRL REUS W/TWL LRG LVL3 (GOWN DISPOSABLE) ×2 IMPLANT
GOWN STRL REUS W/TWL XL LVL3 (GOWN DISPOSABLE) ×8 IMPLANT
GRASPER SUT TROCAR 14GX15 (MISCELLANEOUS) ×2 IMPLANT
HOVERMATT SINGLE USE (MISCELLANEOUS) ×2 IMPLANT
KIT BASIN OR (CUSTOM PROCEDURE TRAY) ×2 IMPLANT
KIT TURNOVER KIT A (KITS) ×2 IMPLANT
MARKER SKIN DUAL TIP RULER LAB (MISCELLANEOUS) ×2 IMPLANT
NEEDLE SPNL 22GX3.5 QUINCKE BK (NEEDLE) ×2 IMPLANT
PACK UNIVERSAL I (CUSTOM PROCEDURE TRAY) ×2 IMPLANT
RELOAD ENDO STITCH (ENDOMECHANICALS) ×2 IMPLANT
RELOAD STAPLER BLUE 60MM (STAPLE) ×4 IMPLANT
RELOAD STAPLER GOLD 60MM (STAPLE) ×1 IMPLANT
RELOAD STAPLER GREEN 60MM (STAPLE) ×1 IMPLANT
SCISSORS LAP 5X45 EPIX DISP (ENDOMECHANICALS) ×2 IMPLANT
SET IRRIG TUBING LAPAROSCOPIC (IRRIGATION / IRRIGATOR) ×2 IMPLANT
SET TUBE SMOKE EVAC HIGH FLOW (TUBING) ×2 IMPLANT
SHEARS HARMONIC ACE PLUS 45CM (MISCELLANEOUS) ×2 IMPLANT
SLEEVE ADV FIXATION 5X100MM (TROCAR) ×4 IMPLANT
SLEEVE GASTRECTOMY 40FR VISIGI (MISCELLANEOUS) ×2 IMPLANT
SOL ANTI FOG 6CC (MISCELLANEOUS) ×1 IMPLANT
SOLUTION ANTI FOG 6CC (MISCELLANEOUS) ×1
SPONGE LAP 18X18 RF (DISPOSABLE) ×2 IMPLANT
STAPLER ECHELON BIOABSB 60 FLE (MISCELLANEOUS) ×8 IMPLANT
STAPLER ECHELON LONG 60 440 (INSTRUMENTS) ×2 IMPLANT
STAPLER RELOAD BLUE 60MM (STAPLE) ×8
STAPLER RELOAD GOLD 60MM (STAPLE) ×2
STAPLER RELOAD GREEN 60MM (STAPLE) ×2
STRIP CLOSURE SKIN 1/2X4 (GAUZE/BANDAGES/DRESSINGS) ×2 IMPLANT
SUT MNCRL AB 4-0 PS2 18 (SUTURE) ×2 IMPLANT
SUT SURGIDAC NAB ES-9 0 48 120 (SUTURE) ×2 IMPLANT
SUT VICRYL 0 TIES 12 18 (SUTURE) ×2 IMPLANT
SYR 10ML ECCENTRIC (SYRINGE) ×2 IMPLANT
SYR 20ML LL LF (SYRINGE) ×2 IMPLANT
TOWEL OR 17X26 10 PK STRL BLUE (TOWEL DISPOSABLE) ×2 IMPLANT
TOWEL OR NON WOVEN STRL DISP B (DISPOSABLE) ×2 IMPLANT
TROCAR ADV FIXATION 5X100MM (TROCAR) ×2 IMPLANT
TROCAR BLADELESS 15MM (ENDOMECHANICALS) ×2 IMPLANT
TROCAR BLADELESS OPT 5 100 (ENDOMECHANICALS) ×2 IMPLANT
TUBING CONNECTING 10 (TUBING) ×2 IMPLANT
TUBING ENDO SMARTCAP (MISCELLANEOUS) ×2 IMPLANT

## 2019-06-25 NOTE — Op Note (Signed)
Operative Note  Christine Mcknight  [id number]  [id number]  06/25/2019   Surgeon: Victorino Sparrow ConnorMD FACS  Assistant: Gurney Maxin MD FACS  Procedure performed: laparoscopic sleeve gastrectomy, hiatal hernia repair, upper endoscopy  Preop diagnosis: Morbid obesity Body mass index is 44.4 kg/m., Sleep apnea, hypothyroidism, hypertension, fibromyalgia, diabetes type 1, depression, anxiety, arthritis  Post-op diagnosis/intraop findings: same, small hiatal hernia  Specimens: fundus Retained items: none EBL: minimal cc Complications: none  Description of procedure: After obtaining informed consent and administration of prophylactic lovenox in holding, the patient was taken to the operating room and placed supine on operating room table wheregeneral endotracheal anesthesia was initiated, preoperative antibiotics were administered, SCDs applied, and a formal timeout was performed. The abdomen was prepped and draped in usual sterile fashion. Peritoneal access was gained using a Visiport technique in the left upper quadrant and insufflation to 15 mmHg ensued without issue. Gross inspection revealed no evidence of injury. Under direct visualization three more 5 mm trochars were placed in the right and left hemiabdomen and the 40m trocar in the right paramedian upper abdomen. Bilateral laparoscopic assisted TAPS blocks were performed with Exparel diluted with 0.25 percent Marcaine with epinephrine. The patient was placed in steep Trendelenburg and the liver retractor was introduced through an incision in the upper midline and secured to the post externally to maintain the left lobe retracted anteriorly.  There appeared to be a small hiatal hernia, though the preoperative upper GI did not identify one. The calibration tubing was passed down by the anesthesiologist and the balloon inflated to 10 mL, this met minimal resistance when being retracted back through the hiatus. The pars flaccida was entered  with Harmonic scalpel and the posterior aspect of the right and left crus were dissected out using Harmonic and blunt dissection. The hiatus was narrowed with a single suture of 0 Ethibond secured with the tied knot device.   Using the Harmonic scalpel, the greater curvature of the stomach was dissected away from the greater omentum and short gastric vessels were divided. This began 6 cm from the pylorus, and dissection proceeded until the left crus was clearly exposed. Esophageal fat pad was mobilized off the anterior stomach slightly. The [id number] was then introduced and directed down towards the pylorus. This was placed to suction against the lesser curve. Serial fires of the linear cutting stapler with seamguards were then employed to create our sleeve. The first fire used a green load and ensured adequate room at the angularis incisura. One gold load and then 3 blue loads were then employed to create a narrow tubular stomach up to the angle of His, staying just lateral to the fat pad. The excised stomach was then removed through our 15 mm trocar site within an Endo Catch bag.   The visigi was taken off of suction and a few puffs of air were introduced, inflating the sleeve. No bubbles were observed and the irrigation fluid around the stomach and the shape was noted to be a nice smooth tube without any narrowing at the angularis. The visigi was then removed. Upper endoscopy was performed by the assistant surgeon and the sleeve was noted to be airtight, evenly tubular, the luminal staple line was hemostatic. Please see his separate note. The endoscope was removed. The 15 mm trocar site fascia in the right upper abdomen was closed with 2 interrupted sutures of 0 Vicryl using the laparoscopic suture passer under direct visualization. The liver retractor was removed under direct visualization. The  abdomen was then desufflated and all remaining trochars removed. The skin incisions were closed with  subcuticular Monocryl; benzoin, Steri-Strips and Band-Aids were applied The patient was then awakened, extubated and taken to PACU in stable condition.    All counts were correct at the completion of the case.

## 2019-06-25 NOTE — Anesthesia Preprocedure Evaluation (Signed)
Anesthesia Evaluation  Patient identified by MRN, date of birth, ID band Patient awake    Reviewed: Allergy & Precautions, NPO status , Patient's Chart, lab work & pertinent test results  Airway Mallampati: III  TM Distance: >3 FB Neck ROM: Full    Dental  (+) Teeth Intact, Dental Advisory Given   Pulmonary    breath sounds clear to auscultation       Cardiovascular hypertension,  Rhythm:Regular Rate:Normal     Neuro/Psych    GI/Hepatic   Endo/Other  diabetes  Renal/GU      Musculoskeletal   Abdominal (+) + obese,   Peds  Hematology   Anesthesia Other Findings   Reproductive/Obstetrics                             Anesthesia Physical Anesthesia Plan  ASA: III  Anesthesia Plan: General   Post-op Pain Management:    Induction: Intravenous  PONV Risk Score and Plan: Ondansetron  Airway Management Planned: Oral ETT  Additional Equipment:   Intra-op Plan:   Post-operative Plan: Extubation in OR  Informed Consent: I have reviewed the patients History and Physical, chart, labs and discussed the procedure including the risks, benefits and alternatives for the proposed anesthesia with the patient or authorized representative who has indicated his/her understanding and acceptance.     Dental advisory given  Plan Discussed with: CRNA and Anesthesiologist  Anesthesia Plan Comments:         Anesthesia Quick Evaluation  

## 2019-06-25 NOTE — Anesthesia Postprocedure Evaluation (Signed)
Anesthesia Post Note  Patient: Jaiana Waage  Procedure(s) Performed: LAPAROSCOPIC GASTRIC SLEEVE RESECTION WITH HIATAL HERNIA REPAIR AND UPPER ENDOSCOPY, ERAS Pathway (N/A Abdomen)     Patient location during evaluation: PACU Anesthesia Type: General Level of consciousness: awake and alert Pain management: pain level controlled Vital Signs Assessment: post-procedure vital signs reviewed and stable Respiratory status: spontaneous breathing, nonlabored ventilation, respiratory function stable and patient connected to nasal cannula oxygen Cardiovascular status: blood pressure returned to baseline and stable Postop Assessment: no apparent nausea or vomiting Anesthetic complications: no    Last Vitals:  Vitals:   06/25/19 1045 06/25/19 1100  BP: 127/62 132/60  Pulse: 67 69  Resp: 15 17  Temp:  (!) 36.4 C  SpO2: 93% 95%    Last Pain:  Vitals:   06/25/19 1100  TempSrc:   PainSc: Asleep                 Chanya Chrisley COKER

## 2019-06-25 NOTE — Progress Notes (Signed)
Discussed post op day goals with patient including ambulation, IS, diet progression, pain, and nausea control.  BSTOP education provided including BSTOP information guide, "Guide for Pain Management after your Bariatric Procedure".  Questions answered. 

## 2019-06-25 NOTE — Discharge Instructions (Signed)
° ° ° °GASTRIC BYPASS/SLEEVE ° Home Care Instructions ° ° These instructions are to help you care for yourself when you go home. ° °Call: If you have any problems. °• Call [phone] and ask for the surgeon on call °• If you need immediate help, come to the ER at [facility].  °• Tell the ER staff that you are a new post-op gastric bypass or gastric sleeve patient °  °Signs and symptoms to report: • Severe vomiting or nausea °o If you cannot keep down clear liquids for longer than 1 day, call your surgeon  °• Abdominal pain that does not get better after taking your pain medication °• Fever over 100.4° F with chills °• Heart beating over 100 beats a minute °• Shortness of breath at rest °• Chest pain °•  Redness, swelling, drainage, or foul odor at incision (surgical) sites °•  If your incisions open or pull apart °• Swelling or pain in calf (lower leg) °• Diarrhea (Loose bowel movements that happen often), frequent watery, uncontrolled bowel movements °• Constipation, (no bowel movements for 3 days) if this happens: Pick one °o Milk of Magnesia, 2 tablespoons by mouth, 3 times a day for 2 days if needed °o Stop taking Milk of Magnesia once you have a bowel movement °o Call your doctor if constipation continues °Or °o Miralax  (instead of Milk of Magnesia) following the label instructions °o Stop taking Miralax once you have a bowel movement °o Call your doctor if constipation continues °• Anything you think is not normal °  °Normal side effects after surgery: • Unable to sleep at night or unable to focus °• Irritability or moody °• Being tearful (crying) or depressed °These are common complaints, possibly related to your anesthesia medications that put you to sleep, stress of surgery, and change in lifestyle.  This usually goes away a few weeks after surgery.  If these feelings continue, call your primary care doctor. °  °Wound Care: You may have surgical glue, steri-strips, or staples over your incisions after  surgery °• Surgical glue:  Looks like a clear film over your incisions and will wear off a little at a time °• Steri-strips: Strips of tape over your incisions. You may notice a yellowish color on the skin under the steri-strips. This is used to make the   steri-strips stick better. Do not pull the steri-strips off - let them fall off °• Staples: Staples may be removed before you leave the hospital °o If you go home with staples, call [facility], [phone] at for an appointment with your surgeon’s nurse to have staples removed 10 days after surgery. °• Showering: You may shower two (2) days after your surgery unless your surgeon tells you differently °o Wash gently around incisions with warm soapy water, rinse well, and gently pat dry  °o No tub baths until staples are removed, steri-strips fall off or glue is gone.  °  °Medications: • Medications should be liquid or crushed if larger than the size of a dime °• Extended release pills (medication that release a little bit at a time through the day) should NOT be crushed or cut. (examples include XL, ER, DR, SR) °• Depending on the size and number of medications you take, you may need to space (take a few throughout the day)/change the time you take your medications so that you do not over-fill your pouch (smaller stomach) °• Make sure you follow-up with your primary care doctor to   make medication changes needed during rapid weight loss and life-style changes °• If you have diabetes, follow up with the doctor that orders your diabetes medication(s) within one week after surgery and check your blood sugar regularly. °• Do not drive while taking prescription pain medication  °• It is ok to take Tylenol by the bottle instructions with your pain medicine or instead of your pain medicine as needed.  DO NOT TAKE NSAIDS (EXAMPLES OF NSAIDS:  IBUPROFREN/ NAPROXEN)  °Diet:                    First 2 Weeks ° You will see the dietician t about two (2) weeks  after your surgery. The dietician will increase the types of foods you can eat if you are handling liquids well: °• If you have severe vomiting or nausea and cannot keep down clear liquids lasting longer than 1 day, call your surgeon @ ([phone]) °Protein Shake °• Drink at least 2 ounces of shake 5-6 times per day °• Each serving of protein shakes (usually 8 - 12 ounces) should have: °o 15 grams of protein  °o And no more than 5 grams of carbohydrate  °• Goal for protein each day: °o Men = 80 grams per day °o Women = 60 grams per day °• Protein powder may be added to fluids such as non-fat milk or Lactaid milk or unsweetened Soy/Almond milk (limit to 35 grams added protein powder per serving) ° °Hydration °• Slowly increase the amount of water and other clear liquids as tolerated (See Acceptable Fluids) °• Slowly increase the amount of protein shake as tolerated  °•  Sip fluids slowly and throughout the day.  Do not use straws. °• May use sugar substitutes in small amounts (no more than 6 - 8 packets per day; i.e. Splenda) ° °Fluid Goal °• The first goal is to drink at least 8 ounces of protein shake/drink per day (or as directed by the nutritionist); some examples of protein shakes are Syntrax Nectar, Adkins Advantage, EAS Edge HP, and Unjury. See handout from pre-op Bariatric Education Class: °o Slowly increase the amount of protein shake you drink as tolerated °o You may find it easier to slowly sip shakes throughout the day °o It is important to get your proteins in first °• Your fluid goal is to drink 64 - 100 ounces of fluid daily °o It may take a few weeks to build up to this °• 32 oz (or more) should be clear liquids  °And  °• 32 oz (or more) should be full liquids (see below for examples) °• Liquids should not contain sugar, caffeine, or carbonation ° °Clear Liquids: °• Water or Sugar-free flavored water (i.e. Fruit H2O, Propel) °• Decaffeinated coffee or tea (sugar-free) °• Crystal Lite, Wyler’s Lite,  Minute Maid Lite °• Sugar-free Jell-O °• Bouillon or broth °• Sugar-free Popsicle:   *Less than 20 calories each; Limit 1 per day ° °Full Liquids: °Protein Shakes/Drinks + 2 choices per day of other full liquids °• Full liquids must be: °o No More Than 15 grams of Carbs per serving  °o No More Than 3 grams of Fat per serving °• Strained low-fat cream soup (except Cream of Potato or Tomato) °• Non-Fat milk °• Fat-free Lactaid Milk °• Unsweetened Soy Or Unsweetened Almond Milk °• Low Sugar yogurt (Dannon Lite & Fit, Greek yogurt; Oikos Triple Zero; Chobani Simply 100; Yoplait 100 calorie Greek - No Fruit on the Bottom) ° °  °Vitamins   and Minerals • Start 1 day after surgery unless otherwise directed by your surgeon °• 2 Chewable Bariatric Specific Multivitamin / Multimineral Supplement with iron (Example: Bariatric Advantage Multi EA) °• Chewable Calcium with Vitamin D-3 °(Example: 3 Chewable Calcium Plus 600 with Vitamin D-3) °o Take 500 mg three (3) times a day for a total of 1500 mg each day °o Do not take all 3 doses of calcium at one time as it may cause constipation, and you can only absorb 500 mg  at a time  °o Do not mix multivitamins containing iron with calcium supplements; take 2 hours apart °• Menstruating women and those with a history of anemia (a blood disease that causes weakness) may need extra iron °o Talk with your doctor to see if you need more iron °• Do not stop taking or change any vitamins or minerals until you talk to your dietitian or surgeon °• Your Dietitian and/or surgeon must approve all vitamin and mineral supplements °  °Activity and Exercise: Limit your physical activity as instructed by your doctor.  It is important to continue walking at home.  During this time, use these guidelines: °• Do not lift anything greater than ten (10) pounds for at least two (2) weeks °• Do not go back to work or drive until your surgeon says you can °• You may have sex when you feel comfortable  °o It is  VERY important for female patients to use a reliable birth control method; fertility often increases after surgery  °o All hormonal birth control will be ineffective for 30 days after surgery due to medications given during surgery a barrier method must be used. °o Do not get pregnant for at least 18 months °• Start exercising as soon as your doctor tells you that you can °o Make sure your doctor approves any physical activity °• Start with a simple walking program °• Walk 5-15 minutes each day, 7 days per week.  °• Slowly increase until you are walking 30-45 minutes per day °Consider joining our BELT program. [phone] or email [email or web address] °  °Special Instructions Things to remember: °• Use your CPAP when sleeping if this applies to you ° °• [facility] has two free Bariatric Surgery Support Groups that meet monthly °o The 3rd Thursday of each month, 6 pm, [facility]  °o The 2nd Friday of each month, 11:45 am in the private dining room in the basement of [facility] °• It is very important to keep all follow up appointments with your surgeon, dietitian, primary care physician, and behavioral health practitioner °• Routine follow up schedule with your surgeon include appointments at 2-3 weeks, 6-8 weeks, 6 months, and 1 year at a minimum.  Your surgeon may request to see you more often.   °o After the first year, please follow up with your bariatric surgeon and dietitian at least once a year in order to maintain best weight loss results °[facility]: [phone] °[facility]: [phone] °Bariatric Nurse Coordinator: [phone] °  °   Reviewed and Endorsed  °by [facility] Patient Education Committee, June, 2016 °Edits Approved: Aug, 2018 ° ° ° °

## 2019-06-25 NOTE — Transfer of Care (Signed)
Immediate Anesthesia Transfer of Care Note  Patient: Christine Mcknight  Procedure(s) Performed: Procedure(s): LAPAROSCOPIC GASTRIC SLEEVE RESECTION WITH HIATAL HERNIA REPAIR AND UPPER ENDOSCOPY, ERAS Pathway (N/A)  Patient Location: PACU  Anesthesia Type:General  Level of Consciousness:  sedated, patient cooperative and responds to stimulation  Airway & Oxygen Therapy:Patient Spontanous Breathing and Patient connected to face mask oxgen  Post-op Assessment:  Report given to PACU RN and Post -op Vital signs reviewed and stable  Post vital signs:  Reviewed and stable  Last Vitals:  Vitals:   06/25/19 0604  BP: 140/67  Pulse: 66  Resp: 18  Temp: 36.9 C  SpO2: [id number]    Complications: No apparent anesthesia complications

## 2019-06-25 NOTE — Op Note (Signed)
Preoperative diagnosis: laparoscopic sleeve gastrectomy  Postoperative diagnosis: Same   Procedure: Upper endoscopy   Surgeon: Gurney Maxin, M.D.  Anesthesia: Gen.   Indications for procedure: This patient was undergoing a laparoscopic sleeve gastrectomy.   Description of procedure: The endoscopy was placed in the mouth and into the oropharynx and under endoscopic vision it was advanced to the esophagogastric junction. The pouch was insufflated and no bleeding or bubbles were seen. The GEJ was identified at 39 cm from the teeth. No bleeding or leaks were detected. The scope was withdrawn without difficulty.   Gurney Maxin, M.D. General, Bariatric, & Minimally Invasive Surgery [facility]

## 2019-06-25 NOTE — Progress Notes (Signed)
PHARMACY CONSULT FOR:  Risk Assessment for Post-Discharge VTE Following Bariatric Surgery  Post-Discharge VTE Risk Assessment: This patient's probability of 30-day post-discharge VTE is increased due to the factors marked:   Female  X  Age >/=60 years    BMI >/=50 kg/m2    CHF    Dyspnea at Rest    Paraplegia  X  Non-gastric-band surgery    Operation Time >/=3 hr    Return to OR     Length of Stay >/= 3 d      Hx of VTE   Hypercoagulable condition   Significant venous stasis   Predicted probability of 30-day post-discharge VTE: 0.31%  Other patient-specific factors to consider: n/a   Recommendation for Discharge: No pharmacologic prophylaxis post-discharge   Cheyney Veleta is a 73 y.o. female who underwent  laparoscopic sleeve gastrectomy on 06/25/19   Case start: 0748 Case end: 0908   No Known Allergies  Patient Measurements: Height: 5\' 5"  (165.1 cm) Weight: 266 lb 12.8 oz (121 kg) IBW/kg (Calculated) : 57 Body mass index is 44.4 kg/m.  No results for input(s): WBC, HGB, HCT, PLT, APTT, CREATININE, LABCREA, CREATININE, CREAT24HRUR, MG, PHOS, ALBUMIN, PROT, ALBUMIN, AST, ALT, ALKPHOS, BILITOT, BILIDIR, IBILI in the last 72 hours. Estimated Creatinine Clearance: 67.7 mL/min (by C-G formula based on SCr of 0.98 mg/dL).    Past Medical History:  Diagnosis Date  . Anxiety   . Arthritis   . Complication of anesthesia    tends to aspirate  . Depression   . Diabetes mellitus without complication ([place])    type 1  dexcom continous glucose meter  . Fibromyalgia   . Headache    history of migraines  . Hypertension   . Hypothyroidism   . PONV (postoperative nausea and vomiting)   . Sleep apnea    cpap     Medications Prior to Admission  Medication Sig Dispense Refill Last Dose  . Cholecalciferol (VITAMIN D) 50 MCG (2000 UT) tablet Take 2,000 Units by mouth daily.    Past Month at Unknown time  . DULoxetine (CYMBALTA) 60 MG capsule Take 60 mg by mouth daily.    06/24/2019 at Unknown time  . FIASP FLEXTOUCH 100 UNIT/ML SOPN Inject 10-20 Units into the skin 3 (three) times daily as needed (blood sugar 200 and over).      . insulin degludec (TRESIBA FLEXTOUCH) 100 UNIT/ML SOPN FlexTouch Pen Inject 55 Units into the skin daily.    06/25/2019 at Unknown time  . levothyroxine (SYNTHROID) 125 MCG tablet Take 125 mcg by mouth daily before breakfast.   06/25/2019 at Unknown time  . losartan-hydrochlorothiazide (HYZAAR) 50-12.5 MG tablet Take 1 tablet by mouth daily.   06/24/2019 at Unknown time  . meloxicam (MOBIC) 7.5 MG tablet Take 7.5 mg by mouth 2 (two) times daily.   06/24/2019 at Unknown time  . Polyethyl Glycol-Propyl Glycol (SYSTANE ULTRA OP) Place 1 drop into both eyes 2 (two) times daily as needed (dry eyes).   06/24/2019 at Unknown time  . pravastatin (PRAVACHOL) 20 MG tablet Take 20 mg by mouth daily.   06/25/2019 at Unknown time  . sodium chloride (OCEAN) 0.65 % SOLN nasal spray Place 1 spray into both nostrils as needed for congestion.   Past Week at Unknown time  . aspirin EC 81 MG tablet Take 81 mg by mouth daily.   More than a month at Unknown time       Jeryn Bertoni P 06/25/2019,10:31 AM

## 2019-06-25 NOTE — Anesthesia Procedure Notes (Signed)
Procedure Name: Intubation Date/Time: 06/25/2019 7:39 AM Performed by: Lavina Hamman, CRNA Pre-anesthesia Checklist: Patient identified, Emergency Drugs available, Suction available, Patient being monitored and Timeout performed Patient Re-evaluated:Patient Re-evaluated prior to induction Oxygen Delivery Method: Circle system utilized Preoxygenation: Pre-oxygenation with 100% oxygen Induction Type: IV induction Ventilation: Mask ventilation without difficulty Laryngoscope Size: Mac and 3 Grade View: Grade II Tube type: Oral Tube size: 7.5 mm Number of attempts: 1 Airway Equipment and Method: Stylet Placement Confirmation: ETT inserted through vocal cords under direct vision,  positive ETCO2,  CO2 detector and breath sounds checked- equal and bilateral Secured at: 21 cm Tube secured with: Tape Dental Injury: Teeth and Oropharynx as per pre-operative assessment

## 2019-06-25 NOTE — H&P (Signed)
Surgical H&P  CC: obesity  HPI: following up today for management of morbid obesity.  I consulted with her just over a year ago and at that time we planned to initiate the bariatric pathway. She reports no major changes in her health and she remains eager to proceed with surgical treatment of her obesity.  She has consulted with the dietitian and psychologist and has been approved from their standpoint to be an excellent candidate for sleeve gastrectomy. Chest x-ray and upper GI were normal, no hiatal hernia. Reportedly has had labs including a hemoglobin A1c, B12, iron, pregnancy test, thyroxine, urinalysis, H. Pylori folic acid through her primary care doctor.  She reports stress related to the pandemic. 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 Reports school from home has been a disaster.   Initial viait 04/06/18: This is a very pleasant and relatively fit 73 year old woman who is here to discuss bariatric surgery. She has been struggling with her weight since her children were born many years ago. She has tried different types of exercises and numerous diets, most recently the keto diet last year which did afford about a 15 pound weight loss but was pretty much unbearable in terms of side effects. 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 She feels depressed about her weight. She has type 1 diabetes which was diagnosed late in life, age 33. Her daughter was diagnosed at age 108 and ultimately has undergone a sleeve gastrectomy followed by kidney pancreas transplant with great results. She also has a history of hypothyroidism and sleep apnea. States that she has no known heart or lung disease.  No tobacco drug or alcohol use.  The patient is a 73 year old female who presents for a bariatric surgery evaluation.Associated  symptoms include depressed mood, lost range of motion and joint pains. Initial onset of obesity was after pregnancy. Initial presentation included inability to lose weight. Disease complications include diabetes (type 1, late onset at age 20) and sleep apnea. Current diet includes well balanced meals. less than once per week. Pertinent family history includes obesity, diabetes and endocrine disorder. The patient is currently able to do activities of daily living without limitations, able to work with limitations and able to do housework without limitations. Surgical history: C-section - elective. Cardiac history: The patient denies smoking. Gastrointestinal History: Patient denies heartburn, dysphagia or irritable bowel disease. MBSQIP recognized comorbidities: Patient has diabetes mellitus and sleep apnea.  No Known Allergies      Past Medical History:  Diagnosis Date  . Diabetes mellitus without complication (HCC)     PSH: multiple c-sections       Family History  Problem Relation Age of Onset  . Cancer Other   . Diabetes Other     Social History        Socioeconomic History  . Marital status: Single    Spouse name: Not on file  . Number of children: Not on file  . Years of education: Not on file  . Highest education level: Not on file  Occupational History  . Not on file  Social Needs  . Financial resource strain: Not on file  . Food insecurity    Worry: Not on file    Inability: Not on file  . Transportation needs    Medical: Not on file  Non-medical: Not on file  Tobacco Use  . Smoking status: Never Smoker  . Smokeless tobacco: Never Used  Substance and Sexual Activity  . Alcohol use: Yes    Comment: occasionally  . Drug use: Not on file  . Sexual activity: Not on file  Lifestyle  . Physical activity    Days per week: Not on file    Minutes per session: Not on file  . Stress: Not on file  Relationships  . Social Product manager on phone: Not on file    Gets together: Not on file    Attends religious service: Not on file    Active member of club or organization: Not on file    Attends meetings of clubs or organizations: Not on file    Relationship status: Not on file  Other Topics Concern  . Not on file  Social History Narrative  . Not on file          Current Outpatient Medications on File Prior to Visit  Medication Sig Dispense Refill  . DULoxetine (CYMBALTA) 60 MG capsule Take 60 mg by mouth daily.    Marland Kitchen FIASP FLEXTOUCH 100 UNIT/ML SOPN     . insulin degludec (TRESIBA FLEXTOUCH) 100 UNIT/ML SOPN FlexTouch Pen Inject 28 Units into the skin daily at 10 pm.    . insulin glargine (LANTUS) 100 UNIT/ML injection Inject 26 Units into the skin daily.    . insulin lispro (HUMALOG) 100 UNIT/ML injection Inject into the skin 3 (three) times daily before meals. Sliding scale @@ 1 unit / 5 grams carb and 1 unit / 20 mg/dl above target of 100 mg/dl    . levothyroxine (SYNTHROID) 125 MCG tablet Take 125 mcg by mouth daily before breakfast.    . polyethylene glycol-electrolytes (NULYTELY/GOLYTELY) 420 g solution U UTD  0  . pravastatin (PRAVACHOL) 20 MG tablet Take 20 mg by mouth daily.    Marland Kitchen SYNTHROID 112 MCG tablet TK 1 T PO QD IN THE MORNING OES  4   No current facility-administered medications on file prior to visit.     Review of Systems: a complete, 10pt review of systems was completed with pertinent positives and negatives as documented in the HPI  Physical Exam: Vitals ([place]; 05/15/2019 9:22 AM) 05/15/2019 9:21 AM Weight: 271 lb Height: 63in Neck: 16in Body Surface Area: 2.2 m Body Mass Index: 48.01 kg/m  Temp.: 96.29F(Temporal)  Pulse: 98 (Regular)  Resp.: 16 (Unlabored)  P.OX: 93% (Room air) BP: 152/74 (Sitting, Left Arm, Standard)  Alert and cooperative. Extraocular motions intact, no scleral icterus Speech is normal Respirations are  even and unlabored No lower extremity edema   No flowsheet data found.  No flowsheet data found.  Recent Labs  No results found for: INR, PROTIME    Imaging: Imaging Results (Last 48 hours)  No results found.     A/P: Morbid obesity with comorbidities of arthritis, back pain, depression, type 1 diabetes, hypercholesterolemia, migraines, obstructive sleep apnea, hypothyroid. Remains an appropriate candidate for sleeve gastrectomy. Although she does have type 1 diabetes, given her age I think that a sleeve gastrectomy would be the better option as it will have a good effect on her insulin resistance as well. We again discussed the surgery including technical aspects, the risks of bleeding, infection, pain, scarring, injury to intra-abdominal structures, staple line leak or abscess, chronic abdominal pain or nausea, new onset or worsened GERD, DVT/PE, pneumonia, heart attack, stroke, death, failure to  reach weight loss goals and weight regain, hernia. Discussed the typical peri- and postoperative course. Discussed the importance of lifelong behavioral changes to combat the chronic and relapsing disease which is obesity. She has done a good amount of research and reading reveals prepared to go forward. Questions were answered. Plan to proceed pending insurance authorization.  Romana Juniper, MD [facility], [place] Pager [phone]

## 2019-06-26 ENCOUNTER — Encounter (HOSPITAL_COMMUNITY): Payer: Self-pay | Admitting: Surgery

## 2019-06-26 LAB — COMPREHENSIVE METABOLIC PANEL
ALT: 22 U/L (ref 0–44)
AST: 25 U/L (ref 15–41)
Albumin: 3.1 g/dL — ABNORMAL LOW (ref 3.5–5.0)
Alkaline Phosphatase: 85 U/L (ref 38–126)
Anion gap: 8 (ref 5–15)
BUN: 21 mg/dL (ref 8–23)
CO2: 23 mmol/L (ref 22–32)
Calcium: 8.2 mg/dL — ABNORMAL LOW (ref 8.9–10.3)
Chloride: 104 mmol/L (ref 98–111)
Creatinine, Ser: 0.96 mg/dL (ref 0.44–1.00)
GFR calc Af Amer: >60 mL/min (ref >60)
GFR calc non Af Amer: 59 mL/min — ABNORMAL LOW (ref >60)
Glucose, Bld: 224 mg/dL — ABNORMAL HIGH (ref 70–99)
Potassium: 4.3 mmol/L (ref 3.5–5.1)
Sodium: 135 mmol/L (ref 135–145)
Total Bilirubin: 0.5 mg/dL (ref 0.3–1.2)
Total Protein: 6.2 g/dL — ABNORMAL LOW (ref 6.5–8.1)

## 2019-06-26 LAB — CBC WITH DIFFERENTIAL/PLATELET
Abs Immature Granulocytes: 0.03 10*3/uL (ref 0.00–0.07)
Basophils Absolute: 0 10*3/uL (ref 0.0–0.1)
Basophils Relative: 0 %
Eosinophils Absolute: 0 10*3/uL (ref 0.0–0.5)
Eosinophils Relative: 0 %
HCT: 32 % — ABNORMAL LOW (ref 36.0–46.0)
Hemoglobin: 10.1 g/dL — ABNORMAL LOW (ref 12.0–15.0)
Immature Granulocytes: 0 %
Lymphocytes Relative: 11 %
Lymphs Abs: 1.3 10*3/uL (ref 0.7–4.0)
MCH: 29.6 pg (ref 26.0–34.0)
MCHC: 31.6 g/dL (ref 30.0–36.0)
MCV: 93.8 fL (ref 80.0–100.0)
Monocytes Absolute: 0.7 10*3/uL (ref 0.1–1.0)
Monocytes Relative: 6 %
Neutro Abs: 9.4 10*3/uL — ABNORMAL HIGH (ref 1.7–7.7)
Neutrophils Relative %: 83 %
Platelets: 332 10*3/uL (ref 150–400)
RBC: 3.41 MIL/uL — ABNORMAL LOW (ref 3.87–5.11)
RDW: 12.4 % (ref 11.5–15.5)
WBC: 11.5 10*3/uL — ABNORMAL HIGH (ref 4.0–10.5)
nRBC: 0 % (ref 0.0–0.2)

## 2019-06-26 LAB — GLUCOSE, CAPILLARY
Glucose-Capillary: 202 mg/dL — ABNORMAL HIGH (ref 70–99)
Glucose-Capillary: 186 mg/dL — ABNORMAL HIGH (ref 70–99)
Glucose-Capillary: 118 mg/dL — ABNORMAL HIGH (ref 70–99)

## 2019-06-26 LAB — MAGNESIUM: Magnesium: 1.9 mg/dL (ref 1.7–2.4)

## 2019-06-26 MED ORDER — ACETAMINOPHEN 500 MG PO TABS: 1000.0000 mg | ORAL_TABLET | Freq: Three times a day (TID) | ORAL | 0 refills | Status: AC

## 2019-06-26 MED ORDER — TRAMADOL HCL 50 MG PO TABS: 50.0000 mg | ORAL_TABLET | Freq: Four times a day (QID) | ORAL | 0 refills | Status: DC | PRN

## 2019-06-26 MED ORDER — TRESIBA FLEXTOUCH 100 UNIT/ML ~~LOC~~ SOPN: 40.0000 [IU] | PEN_INJECTOR | Freq: Every day | SUBCUTANEOUS | Status: DC

## 2019-06-26 MED ORDER — LOSARTAN POTASSIUM 25 MG PO TABS: 25.0000 mg | ORAL_TABLET | Freq: Every day | ORAL | 0 refills | Status: DC

## 2019-06-26 MED ORDER — ONDANSETRON 4 MG PO TBDP: 4.0000 mg | ORAL_TABLET | Freq: Four times a day (QID) | ORAL | 0 refills | Status: DC | PRN

## 2019-06-26 MED ORDER — PANTOPRAZOLE SODIUM 40 MG PO TBEC: 40.0000 mg | DELAYED_RELEASE_TABLET | Freq: Every day | ORAL | 4 refills | Status: DC

## 2019-06-26 NOTE — Discharge Summary (Signed)
Physician Discharge Summary  Christine Mcknight DIY:[id number] DOB: 05-05-1946 DOA: 06/25/2019  PCP: Jonathon Jordan, MD  Admit date: 06/25/2019 Discharge date: 06/26/2019   Recommendations for Outpatient Follow-up:   Follow-up Information    Clovis Riley, MD. Go on 07/11/2019.   Specialty: General Surgery Why: at 1120. Contact information: [place] [address] [phone]        Clovis Riley, MD .   Specialty: General Surgery Contact information: [address] [place] [address] [phone]        Delrae Rend, MD. Schedule an appointment as soon as possible for a visit in 1 week(s).   Specialty: Endocrinology Why: Follow up with endocrinologist post bariatric surgery.   Contact information: [address] [place] [address] [phone]          Discharge Diagnoses:  Active Problems:   Morbid obesity ([place])   Surgical Procedure: Laparoscopic Sleeve Gastrectomy, upper endoscopy  Discharge Condition: Good Disposition: Home  Diet recommendation: Postoperative sleeve gastrectomy diet (liquids only)  Filed Weights   06/25/19 0604  Weight: 121 kg     Hospital Course:  The patient was admitted for a planned laparoscopic sleeve gastrectomy. Please see operative note. Preoperatively the patient was given lovenox for DVT prophylaxis. Postoperative prophylactic Lovenox dosing was started on the evening of postoperative day 0. ERAS protocol was used. On the evening of postoperative day 0, the patient was started on water and ice chips. On postoperative day 1 the patient had no fever or tachycardia and was tolerating water in their diet was gradually advanced throughout the day. The patient was ambulating without difficulty. Their vital signs are stable without fever or tachycardia. Their hemoglobin had remained stable. The patient was maintained on their home settings for CPAP therapy.  The patient had received discharge instructions and counseling. They were deemed stable for discharge and had met discharge criteria   Discharge Instructions  Discharge Instructions    Ambulate hourly while awake   Complete by: As directed    Call MD for:  difficulty breathing, headache or visual disturbances   Complete by: As directed    Call MD for:  persistant dizziness or light-headedness   Complete by: As directed    Call MD for:  persistant nausea and vomiting   Complete by: As directed    Call MD for:  redness, tenderness, or signs of infection (pain, swelling, redness, odor or green/yellow discharge around incision site)   Complete by: As directed    Call MD for:  severe uncontrolled pain   Complete by: As directed    Call MD for:  temperature >101 F   Complete by: As directed    Diet bariatric full liquid   Complete by: As directed    Incentive spirometry   Complete by: As directed    Perform hourly while awake     Allergies as of 06/26/2019   No Known Allergies     Medication List    STOP taking these medications   aspirin EC 81 MG tablet   losartan-hydrochlorothiazide 50-12.5 MG tablet Commonly known as: HYZAAR   meloxicam 7.5 MG tablet Commonly known as: MOBIC     TAKE these medications   acetaminophen 500 MG tablet Commonly known as: TYLENOL Take 2 tablets (1,000 mg total) by mouth every 8 (eight) hours for 5 days.   DULoxetine 60 MG capsule Commonly known as: CYMBALTA Take 60 mg by mouth daily.  Fiasp FlexTouch 100 UNIT/ML Sopn Generic drug: Insulin Aspart (w/Niacinamide) Inject 10-20 Units into the skin 3 (three) times daily as needed (blood sugar 200 and over).   losartan 25 MG tablet Commonly known as: Cozaar Take 1 tablet (25 mg total) by mouth daily. Notes to patient: This is a lower dose of your previous blood pressure medication and without the diuretic. Keep a log of your blood pressure at home and notify your primary care provider if  blood pressure is running high or low as this medication may need to be adjusted quickly after surgery   ondansetron 4 MG disintegrating tablet Commonly known as: ZOFRAN-ODT Take 1 tablet (4 mg total) by mouth every 6 (six) hours as needed for nausea or vomiting.   pantoprazole 40 MG tablet Commonly known as: PROTONIX Take 1 tablet (40 mg total) by mouth daily. Notes to patient: Take this medication every day for at least 1 month following surgery   pravastatin 20 MG tablet Commonly known as: PRAVACHOL Take 20 mg by mouth daily.   sodium chloride 0.65 % Soln nasal spray Commonly known as: OCEAN Place 1 spray into both nostrils as needed for congestion.   Synthroid 125 MCG tablet Generic drug: levothyroxine Take 125 mcg by mouth daily before breakfast.   SYSTANE ULTRA OP Place 1 drop into both eyes 2 (two) times daily as needed (dry eyes).   traMADol 50 MG tablet Commonly known as: ULTRAM Take 1 tablet (50 mg total) by mouth every 6 (six) hours as needed (pain).   Tyler Aas FlexTouch 100 UNIT/ML Sopn FlexTouch Pen Generic drug: insulin degludec Inject 0.4 mLs (40 Units total) into the skin daily. What changed: how much to take Notes to patient: Dose decreased- please notify your endocrinologist if blood sugars running high or low as insulin dosing may need to be adjusted quickly and frequently in the post-op period.   Vitamin D 50 MCG (2000 UT) tablet Take 2,000 Units by mouth daily.      Follow-up Information    Clovis Riley, MD. Go on 07/11/2019.   Specialty: General Surgery Why: at 1120. Contact information: [place] [address] [phone]        Clovis Riley, MD .   Specialty: General Surgery Contact information: [address] [place] [address] [phone]        Delrae Rend, MD. Schedule an appointment as soon as possible for a visit in 1 week(s).   Specialty:  Endocrinology Why: Follow up with endocrinologist post bariatric surgery.   Contact information: [address] [place] [address] [phone]            The results of significant diagnostics from this hospitalization (including imaging, microbiology, ancillary and laboratory) are listed below for reference.    Significant Diagnostic Studies: No results found.  Labs: Basic Metabolic Panel: Recent Labs  Lab 06/26/19 0356  NA 135  K 4.3  CL 104  CO2 23  GLUCOSE 224*  BUN 21  CREATININE 0.96  CALCIUM 8.2*  MG 1.9   Liver Function Tests: Recent Labs  Lab 06/26/19 0356  AST 25  ALT 22  ALKPHOS 85  BILITOT 0.5  PROT 6.2*  ALBUMIN 3.1*    CBC: Recent Labs  Lab 06/26/19 0356  WBC 11.5*  NEUTROABS 9.4*  HGB 10.1*  HCT 32.0*  MCV 93.8  PLT 332    CBG: Recent Labs  Lab 06/25/19 2032 06/25/19 2348 06/26/19  0355 06/26/19 0733 06/26/19 1138  GLUCAP 158* 188* 202* 186* 118*    Active Problems:   Morbid obesity ([place])   Signed:  Clovis Riley, [facility], [place] 06/27/2019, 11:45 AM

## 2019-06-26 NOTE — Progress Notes (Signed)
Patient alert and oriented, pain is controlled. Patient is tolerating fluids, advanced to protein shake today, patient is tolerating well.  Reviewed Gastric sleeve discharge instructions with patient and patient is able to articulate understanding.  Provided information on BELT program, Support Group and [facility] outpatient pharmacy. All questions answered, will continue to monitor.  Total fluid intake 640 Per dehydration protocol call back one week post op

## 2019-06-26 NOTE — Progress Notes (Signed)
S: Slept okay, denies pain, did have some nausea yesterday afternoon but none overnight, tolerating liquids and has started protein shakes this morning.  Passing flatus.  Did walk couple times, had pretty significant dizziness yesterday and last night but this seems to be better this morning. Meds overnight- received toradol x 1, zofran x 1, reglan x 1, scheduled tylenol/gabapentin  Vitals, labs, intake/output, and orders reviewed at this time. Afebrile, HR 60-70s, Normo to mildly hypertensive, sats 93-100% (CPAP overnight). CMP unremarkable, WBC 11.5 (6.8 preop), hgb 10.1 (11.6), plt 332 (329). BG 158-212. PO 360, UOP 650  Gen: A&Ox3, no distress  H&N: EOMI, atraumatic, neck supple Chest: unlabored respirations, RRR Abd: soft, non-tender, nondistended, incisions all clean dry intact with Steri-Strips and Band-Aids in place, no cellulitis or hematoma with the exception of the subxiphoid incision which is bleeding and has saturated a bulky gauze dressing this morning. Ext: warm, no edema Neuro: grossly normal  Lines/tubes/drains: PIV  A/P:  POD 1 sleeve gastrectomy, doing well Continue clears/ protein shakes Ambulate, pulmonary toilet Continue SCD/ lovenox  Bleeding from incision-direct pressure held with single digit for about 2 minutes with cessation of bleeding.  Gentle pressure dressing applied.  Discontinue Toradol.  Continue prophylactic Lovenox.  Dizziness-discontinue gabapentin, Robaxin and Dilaudid, I suspect her dizziness was secondary to medication effect  Plan discharge home later today.  Romana Juniper, MD [facility], [place] Pager [phone]

## 2019-06-26 NOTE — Plan of Care (Signed)
All goals met. 

## 2019-06-26 NOTE — Progress Notes (Signed)
Pt alert, oriented, tolerating liquids.  D/C instructions given by Parks Neptune, RN.  Pt d/cd home.

## 2019-06-26 NOTE — Progress Notes (Signed)
Patient alert and oriented, Post op day 1.  Provided support and encouragement.  Encouraged pulmonary toilet, ambulation and small sips of liquids.  All questions answered.  Will continue to monitor. 

## 2019-07-01 ENCOUNTER — Telehealth (HOSPITAL_COMMUNITY): Payer: Self-pay

## 2019-07-05 LAB — SURGICAL PATHOLOGY

## 2019-07-05 NOTE — Telephone Encounter (Signed)
Patient called back [name] to discuss post bariatric surgery follow up questions.  See below:   1.  Tell me about your pain and pain management?has not had pain  2.  Let's talk about fluid intake.  How much total fluid are you taking in?64 ounces  3.  How much protein have you taken in the last 2 days?60 grams  4.  Have you had nausea?  Tell me about when have experienced nausea and what you did to help?denies  5.  Has the frequency or color changed with your urine?no problem  6.  Tell me what your incisions look like?no problem  7.  Have you been passing gas? BM?BM since discharge no problem  8.  If a problem or question were to arise who would you call?  Do you know contact numbers for [place], [facility], and [facility]?needed to change [facility] appointment provided contact to patient  9.  How has the walking going?walking regularly  10.  How are your vitamins and calcium going?  How are you taking them?no problems

## 2019-07-09 ENCOUNTER — Ambulatory Visit: Payer: Medicare Other

## 2019-07-16 ENCOUNTER — Ambulatory Visit: Payer: Medicare Other | Admitting: Dietician

## 2019-07-16 ENCOUNTER — Encounter: Payer: Medicare Other | Attending: Surgery | Admitting: Skilled Nursing Facility1

## 2019-07-16 ENCOUNTER — Other Ambulatory Visit: Payer: Self-pay

## 2019-07-16 DIAGNOSIS — E108 Type 1 diabetes mellitus with unspecified complications: Secondary | ICD-10-CM | POA: Insufficient documentation

## 2019-07-16 DIAGNOSIS — E669 Obesity, unspecified: Secondary | ICD-10-CM | POA: Insufficient documentation

## 2019-07-16 NOTE — Progress Notes (Signed)
2 Week Post-Operative Nutrition Class   Patient was seen on 11/20/18 for Post-Operative Nutrition education at [facility].    Pt reports dark urine and dry mouth with some headaches.   Pt reports about 64 ounces average of fluid daily.  Pt states she feels she is not getting enough water due to forgetting to drink often enough.  Pt states she walks 2-3 days a week and feels exhausted afterwards walking for 15-20 minutes. Pt states she experiences pain in her knee and leg. Pt states she has been thinking about taking up biking. Pt states she will try and use her 10 pounds weights while she is watching TV to help her to ease into more meaningful exercise without pain once discussed with her surgeon.  Pt states she cannot take her arthritis medication so she is in more pain: dietitian advised she ask her surgeon when she can start that medication back up or if it is ill advised for an alternative.   Surgery date: 06/25/2019 Surgery type: sleeve [facility] weight at [facility]: 262.2 Weight today: 254.3   Body Composition Scale 07/16/19  Total Body Fat % 48.4  Visceral Fat 21  Fat-Free Mass % 51.5   Total Body Water % 40.2   Muscle-Mass lbs 28.5  Body Fat Displacement          Torso  lbs 76.3         Left Leg  lbs 15.2         Right Leg  lbs 15.2         Left Arm  lbs 7.6         Right Arm   lbs 7.6     The following the learning objectives were met by the patient during this course:  Identifies Phase 3 (Soft, High Proteins) Dietary Goals and will begin from 2 weeks post-operatively to 2 months post-operatively  Identifies appropriate sources of fluids and proteins   States protein recommendations and appropriate sources post-operatively  Identifies the need for appropriate texture modifications, mastication, and bite sizes when consuming solids  Identifies appropriate multivitamin and calcium sources post-operatively  Describes the need for physical  activity post-operatively and will follow MD recommendations  States when to call healthcare provider regarding medication questions or post-operative complications   Handouts given during class include:  Phase 3A: Soft, High Protein Diet Handout   Follow-Up Plan: Patient will follow-up at [facility] in 6 weeks for 2 month post-op nutrition visit for diet advancement per MD.

## 2019-07-22 ENCOUNTER — Telehealth: Payer: Self-pay | Admitting: Skilled Nursing Facility1

## 2019-07-22 NOTE — Telephone Encounter (Signed)
RD called pt to verify fluid intake once starting soft, solid proteins 2 week post-bariatric surgery.   Daily Fluid intake: Daily Protein intake:  Concerns/issues:   LVM 

## 2019-08-20 ENCOUNTER — Other Ambulatory Visit: Payer: Self-pay

## 2019-08-20 ENCOUNTER — Encounter: Payer: Medicare Other | Attending: Surgery | Admitting: Skilled Nursing Facility1

## 2019-08-20 DIAGNOSIS — E108 Type 1 diabetes mellitus with unspecified complications: Secondary | ICD-10-CM | POA: Insufficient documentation

## 2019-08-20 DIAGNOSIS — E669 Obesity, unspecified: Secondary | ICD-10-CM | POA: Insufficient documentation

## 2019-08-20 NOTE — Patient Instructions (Addendum)
If you choose to eat cranberries: measure out 1/4 a cup with a measuring cup and cut that portion in half  If you choose to eat pie wait at least 3 hours from eating the cranberries and use sugar free/fat free cool whip: your slice should be just about paper thin    Open a can of green beans to have with your [place] and cranberries

## 2019-08-20 NOTE — Progress Notes (Signed)
Bariatric Nutrition Follow-Up Visit Medical Nutrition Therapy   2 Months Post-Operative Sleeve Surgery Surgery Date: 06/25/19   NUTRITION ASSESSMENT   Anthropometrics  Start weight at [facility]: 262.2 lbs (date: 05/30/2019) Today's weight: 246.3 lbs Weight change: 16 lbs (since previous nutrition visit)   Body Composition Scale 07/16/19 08/20/2019  Total Body Fat % 48.4 47.7  Visceral Fat 21 20  Fat-Free Mass % 51.5 52.2   Total Body Water % 40.2 40.6   Muscle-Mass lbs 28.5 28.4  Body Fat Displacement           Torso  lbs 76.3 72.8         Left Leg  lbs 15.2 14.5         Right Leg  lbs 15.2 14.5         Left Arm  lbs 7.6 7.2         Right Arm   lbs 7.6 7.2   Clinical  Medical hx: Type 1 DM Medications: Insulin Labs:    Lifestyle & Dietary Hx   Pt states she still struggles with drinking enough. Pt states her urine is dark. Pt states she feels her C-PAP and diabetes make her dehydration worse. Pt states her blood sugar has gone over 300 a couple times from it getting low: 50 and dropping (unsure why) in the middle of the night so Dexcom wakes her up and she feels extremely hot/shaky/cold/sweaty correcting with 2 glucose tablets and then watched for 30 minutes and then ate a little debbie which then corrected the number but then the next morning over 300.   Pt states she does not know when she sees her endocrinologist yet so she is going to contact them as well as ask for thyroid labs because she feels it needs to be checked.  Pt admits to eating peas and states she took insulin to cover it but it dropped it too low.  Pt struggles to understand what and how she eats will result in her weight fluctuating.  Pt states she really loves cranberries and understands if she eats it, that will play a part in slow weight loss and due to the content of sugar may cause GI distress. Pt states they are not cooking for thanksgiving so she is not willing to try different healthier recipes.  Pt  struggles to stay within the phasing due to just the challenging nature of changing how she has eaten for 72 years.  Pt states her daughter and the grandkids have junky carbs in the pantry every day with everyone eating it around it and states her daughter is not supportive of her journey.   Estimated daily fluid intake: 55 oz Estimated daily protein intake: 60+ g Supplements: opurity and calcium  Current average weekly physical activity: walking 2-3 days a Week (educated on insulin and blood sugar drop)  24-Hr Dietary Recall: 2 sugar free pudding a day First Meal: half premier protein shake then sips on it the rest of the day and cup of decaf coffee or scrambled egg cooked in butter  Snack: sugar free pudding or jello or cheese stick Second Meal: cottage cheese and sugar free jello Snack: suagr free pudding Third Meal: meatball or small hamburger with ketchup  Snack: sugar free hot chocolate with lactaid milk Beverages: decaf coffee, sugar free hot chocolate with lactaid milk  Post-Op Goals/ Signs/ Symptoms Using straws: no Drinking while eating: no Chewing/swallowing difficulties: no Changes in vision: no Changes to mood/headaches: no Hair loss/changes to skin/nails:  no Difficulty focusing/concentrating: no Sweating: no Dizziness/lightheadedness: no Palpitations: no  Carbonated/caffeinated beverages: no N/V/D/C/Gas: no Abdominal pain: no Dumping syndrome: no    NUTRITION DIAGNOSIS  Overweight/obesity ([place]-3.3) related to past poor dietary habits and physical inactivity as evidenced by completed bariatric surgery and following dietary guidelines for continued weight loss and healthy nutrition status.     NUTRITION INTERVENTION Nutrition counseling (C-1) and education (E-2) to facilitate bariatric surgery goals, including: . Diet advancement to the next phase now including non starchy vegetables . The importance of consuming adequate calories as well as certain nutrients daily  due to the body's need for essential vitamins, minerals, and fats . The importance of daily physical activity and to reach a goal of at least 150 minutes of moderate to vigorous physical activity weekly (or as directed by their physician) due to benefits such as increased musculature and improved lab values  Learning Style & Readiness for Change Teaching method utilized: Visual & Auditory  Demonstrated degree of understanding via: Teach Back  Barriers to learning/adherence to lifestyle change:   RD's Notes for Next Visit Assess adherence to pt chosen goals If you choose to eat cranberries: measure out 1/4 a cup with a measuring cup and cut that portion in half  If you choose to eat pie wait at least 3 hours from eating the cranberries and use sugar free/fat free cool whip: your slice should be just about paper thin    Open a can of green beans to have with your [place] and cranberries  Before you reach for the "junk" food take a moment to determine why you are about to eat it and after relaxing it is not due to hunger move on and find something else to do  RD notes for next time: Pt reported at the end of the appt her family that live with her are not supportive as well as her daughter making discouraging remarks this was not able to be unpacked due to it being the end of the appt so this will be discussed at her next appt.   MONITORING & EVALUATION

## 2019-09-16 ENCOUNTER — Other Ambulatory Visit: Payer: Self-pay

## 2019-09-16 ENCOUNTER — Encounter: Payer: Medicare Other | Attending: Surgery | Admitting: Skilled Nursing Facility1

## 2019-09-16 DIAGNOSIS — E669 Obesity, unspecified: Secondary | ICD-10-CM | POA: Insufficient documentation

## 2019-09-16 DIAGNOSIS — E108 Type 1 diabetes mellitus with unspecified complications: Secondary | ICD-10-CM | POA: Insufficient documentation

## 2019-09-16 NOTE — Progress Notes (Signed)
Bariatric Nutrition Follow-Up Visit Medical Nutrition Therapy   Post-Operative Sleeve Surgery Surgery Date: 06/25/19   NUTRITION ASSESSMENT   Anthropometrics  Start weight at [facility]: 262.2 lbs (date: 05/30/2019) Today's weight: 246.3 lbs Weight change: -6 lbs (since previous nutrition visit)   Body Composition Scale 07/16/19 08/20/2019 09/16/2019  Total Body Fat % 48.4 47.7 47.1  Visceral Fat 21 20 19   Fat-Free Mass % 51.5 52.2 52.8   Total Body Water % 40.2 40.6 40.9   Muscle-Mass lbs 28.5 28.4 28.3  Body Fat Displacement            Torso  lbs 76.3 72.8 70.1         Left Leg  lbs 15.2 14.5 14         Right Leg  lbs 15.2 14.5 14         Left Arm  lbs 7.6 7.2 7         Right Arm   lbs 7.6 7.2 7   Clinical  Medical hx: Type 1 DM Medications: Insulin Labs:    Lifestyle & Dietary Hx  Pt states she blood sugar was 49 this morning so then she ate 4 mini scones but then did not check her blood sugar and just went back to bed. Pt states she has not talked with her endocrinologist. Pt states she probably needs to cut back on her long acting insulin: Dietitian advised pt to contact her endocrinologist TODAY.  Pt states yesterday she last ate at 7 or 8pm: 1 salami and cheese and salad: lettuce, quinoa, bacon, cranberries, vinaggerrett.  The low was at 4:45am. Pt reports feeling extremely tired most days of the week. Pt states she checks her bloods sugars about 3-4 times a day: Fasting: 99; sometimes before meals sometimes after meals mid day and before bed and late evening: stating if she has a blood sugar 273 or higher she does not correct because if she does it will be too low in the morning: numbers in the day: 199 with protein, 1/8 bagel with cream cheese 250-300. Pt states she is sick of eating the same foods over and over again. Pt states she tried edamame and likes it. Pt states her daughter is not supportive of her but [place]  cannot afford to live there. Pt states her eldest granddaughter has mental health issues and the youngest grandchild struggles with OCD so this has been very stressful and keeps her from leaving her. Pt states [place]. Pt states she has been reading to do deal with her stress level and will start writing again. 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 Pt states drinking has gotten better with drinking zero suagr sports drinks and keeping water in multiple locations around the house.   Estimated daily fluid intake: 55 oz Estimated daily protein intake: 60+ g Supplements: opurity and calcium  Current average weekly physical activity: playing with her dogs  24-Hr Dietary Recall: 2 sugar free pudding a day First Meal: decaff coffee and then half hour later half protein shake  Snack:  Second Meal: pack of tuna or chicken  Snack:  Third Meal: meatball or small hamburger with ketchup or pot roast with potatoes  Snack: sugar free hot chocolate with lactaid milk or sugar free pudding or sugar free popcicle or cheese stick  Beverages: decaf coffee, sugar free hot  chocolate with lactaid milk, water   Post-Op Goals/ Signs/ Symptoms Using straws: no Drinking while eating: no Chewing/swallowing difficulties: no Changes in vision: no Changes to mood/headaches: no Hair loss/changes to skin/nails: no Difficulty focusing/concentrating: no Sweating: no Dizziness/lightheadedness: no Palpitations: no  Carbonated/caffeinated beverages: no N/V/D/C/Gas: no Abdominal pain: no Dumping syndrome: no    NUTRITION DIAGNOSIS  Overweight/obesity ([place]-3.3) related to past poor dietary habits and physical inactivity as evidenced by completed bariatric surgery and following dietary guidelines for continued weight loss and healthy nutrition status.     NUTRITION INTERVENTION Nutrition counseling (C-1) and education (E-2) to facilitate bariatric  surgery goals, including: . The importance of consuming adequate calories as well as certain nutrients daily due to the body's need for essential vitamins, minerals, and fats . The importance of daily physical activity and to reach a goal of at least 150 minutes of moderate to vigorous physical activity weekly (or as directed by their physician) due to benefits such as increased musculature and improved lab values Goals: Work on Associate Professor meals Talk with your endocrinologist TODAY about your blood sugars   Learning Style & Readiness for Change Teaching method utilized: Visual & Auditory  Demonstrated degree of understanding via: Teach Back  Barriers to learning/adherence to lifestyle change:   RD's Notes for Next Visit Assess adherence to pt chosen goals Check in on housing status  MONITORING & EVALUATION

## 2019-09-30 DIAGNOSIS — Z794 Long term (current) use of insulin: Secondary | ICD-10-CM | POA: Diagnosis not present

## 2019-09-30 DIAGNOSIS — E109 Type 1 diabetes mellitus without complications: Secondary | ICD-10-CM | POA: Diagnosis not present

## 2019-09-30 DIAGNOSIS — Z79899 Other long term (current) drug therapy: Secondary | ICD-10-CM | POA: Diagnosis not present

## 2019-09-30 DIAGNOSIS — K909 Intestinal malabsorption, unspecified: Secondary | ICD-10-CM | POA: Diagnosis not present

## 2019-09-30 DIAGNOSIS — Z9884 Bariatric surgery status: Secondary | ICD-10-CM | POA: Diagnosis not present

## 2019-09-30 DIAGNOSIS — E1165 Type 2 diabetes mellitus with hyperglycemia: Secondary | ICD-10-CM | POA: Diagnosis not present

## 2019-09-30 DIAGNOSIS — E559 Vitamin D deficiency, unspecified: Secondary | ICD-10-CM | POA: Diagnosis not present

## 2019-09-30 DIAGNOSIS — E039 Hypothyroidism, unspecified: Secondary | ICD-10-CM | POA: Diagnosis not present

## 2019-10-11 NOTE — Progress Notes (Signed)
[facility] Note  10/21/2019     CHIEF COMPLAINT Patient presents for Retina Evaluation and Diabetic Eye Exam   HISTORY OF PRESENT ILLNESS: Christine Mcknight is a 74 y.o. female who presents to the clinic today for:   HPI    Retina Evaluation    In both eyes.  This started 1 month ago.  Associated Symptoms Negative for Flashes, Pain, Trauma, Fever, Weight Loss, Scalp Tenderness, Redness, Floaters, Distortion, Photophobia, Jaw Claudication, Fatigue, Shoulder/Hip pain, Glare and Blind Spot.  Context:  distance vision, mid-range vision and near vision.  Treatments tried include no treatments.  I, the attending physician,  performed the HPI with the patient and updated documentation appropriately.          Diabetic Eye Exam    Vision fluctuates with blood sugars.  Associated Symptoms Negative for Flashes, Pain, Trauma, Fever, Weight Loss, Scalp Tenderness, Redness, Floaters, Distortion, Photophobia, Jaw Claudication, Fatigue, Shoulder/Hip pain, Glare and Blind Spot.  Diabetes characteristics include Type 1 and on insulin.  This started 22 years ago.  Blood sugar level fluctuates.  Last Blood Glucose 121.  Last A1C 9.  I, the attending physician,  performed the HPI with the patient and updated documentation appropriately.          Comments    Referral of DR. Buddy Duty for DME . Patient states her vision is good, denies flashes, floaters and ocular pian. Pt is DM1 x 22 yrs, BS 121 this am, A1c 9, BS fluctuates, pt is on  insulin  Fiasp, Tresiba.        Last edited by Bernarda Caffey, MD on 10/21/2019  9:24 AM. (History)    Patient is here for diabetic eye exam. Had bariatric surgery on 09.29.20. Last a1c was 9.0 on 09.29.20. Patient is a type 1 diabetic for 22 years. Blood sugar fluctuates. Vision seems good OU.  Referring physician: Jonathon Jordan, MD [address] [address],  [place] [address]  HISTORICAL INFORMATION:   Selected notes from the  MEDICAL RECORD NUMBER Referred by Dr. Carl Best DM Patient had bariatric surgery 09.29.20 Last a1c was 9.0 on 09.29.20 Hx shingles OS about 15 years ago with ocular involvement   CURRENT MEDICATIONS: Current Outpatient Medications (Ophthalmic Drugs)  Medication Sig  . Polyethyl Glycol-Propyl Glycol (SYSTANE ULTRA OP) Place 1 drop into both eyes 2 (two) times daily as needed (dry eyes).   No current facility-administered medications for this visit. (Ophthalmic Drugs)   Current Outpatient Medications (Other)  Medication Sig  . Cholecalciferol (VITAMIN D) 50 MCG (2000 UT) tablet Take 2,000 Units by mouth daily.   . DULoxetine (CYMBALTA) 60 MG capsule Take 60 mg by mouth daily.  Marland Kitchen FIASP FLEXTOUCH 100 UNIT/ML SOPN Inject 10-20 Units into the skin 3 (three) times daily as needed (blood sugar 200 and over).   . insulin degludec (TRESIBA FLEXTOUCH) 100 UNIT/ML SOPN FlexTouch Pen Inject 0.4 mLs (40 Units total) into the skin daily.  Marland Kitchen levothyroxine (SYNTHROID) 125 MCG tablet Take 125 mcg by mouth daily before breakfast.  . losartan (COZAAR) 25 MG tablet Take 1 tablet (25 mg total) by mouth daily.  . pantoprazole (PROTONIX) 40 MG tablet Take 1 tablet (40 mg total) by mouth daily.  . pravastatin (PRAVACHOL) 20 MG tablet Take 20 mg by mouth daily.  . sodium chloride (OCEAN) 0.65 % SOLN nasal spray Place 1 spray into both nostrils as needed for congestion.  . ondansetron (ZOFRAN-ODT) 4 MG disintegrating tablet Take 1  tablet (4 mg total) by mouth every 6 (six) hours as needed for nausea or vomiting. (Patient not taking: Reported on 10/21/2019)  . traMADol (ULTRAM) 50 MG tablet Take 1 tablet (50 mg total) by mouth every 6 (six) hours as needed (pain). (Patient not taking: Reported on 10/21/2019)   No current facility-administered medications for this visit. (Other)      REVIEW OF SYSTEMS: ROS    Positive for: Endocrine, Eyes   Negative for: Constitutional, Gastrointestinal, Neurological, Skin,  Genitourinary, Musculoskeletal, HENT, Cardiovascular, Respiratory, Psychiatric, Allergic/Imm, Heme/Lymph   Last edited by Zenovia Jordan, LPN on 624THL  579FGE AM. (History)       ALLERGIES No Known Allergies  PAST MEDICAL HISTORY Past Medical History:  Diagnosis Date  . Anxiety   . Arthritis   . Complication of anesthesia    tends to aspirate  . Depression   . Diabetes mellitus without complication ([place])    type 1  dexcom continous glucose meter  . Fibromyalgia   . Headache    history of migraines  . Hypertension   . Hypothyroidism   . PONV (postoperative nausea and vomiting)   . Sleep apnea    cpap   Past Surgical History:  Procedure Laterality Date  . CESAREAN SECTION     X2  . DILATION AND CURETTAGE OF UTERUS     x 2  . LAPAROSCOPIC GASTRIC SLEEVE RESECTION N/A 06/25/2019   Procedure: LAPAROSCOPIC GASTRIC SLEEVE RESECTION WITH HIATAL HERNIA REPAIR AND UPPER ENDOSCOPY, ERAS Pathway;  Surgeon: Clovis Riley, MD;  Location: [facility];  Service: General;  Laterality: N/A;  . TONSILLECTOMY      FAMILY HISTORY Family History  Problem Relation Age of Onset  . Cancer Other   . Diabetes Other   . Cancer Mother   . Cataracts Mother   . Cataracts Father     SOCIAL HISTORY Social History   Tobacco Use  . Smoking status: Never Smoker  . Smokeless tobacco: Never Used  Substance Use Topics  . Alcohol use: Not Currently    Comment: occasionally  . Drug use: Never         OPHTHALMIC EXAM:  Base Eye Exam    Visual Acuity (Snellen - Linear)      Right Left   Dist [place] 20/20 -2 20/40 -1   Dist ph [place] 20/20 -1 20/30 -2       Tonometry (Tonopen, 8:58 AM)      Right Left   Pressure 11 12       Pupils      Dark Light Shape React APD   Right 3 2 Round Brisk None   Left 3 2 Round Brisk None       Visual Fields      Left Right    Full Full       Extraocular Movement      Right Left    Full, Ortho Full, Ortho       Neuro/Psych    Oriented x3: Yes    Mood/Affect: Normal       Dilation    Both eyes: 1.0% Mydriacyl, 2.5% Phenylephrine @ 8:58 AM        Slit Lamp and Fundus Exam    Slit Lamp Exam      Right Left   Lids/Lashes Dermatochalasis - upper lid, mild Meibomian gland dysfunction Dermatochalasis - upper lid, Meibomian gland dysfunction   Conjunctiva/Sclera white and quiet white and quiet   Cornea trace Punctate epithelial erosions  trace  Punctate epithelial erosions   Anterior Chamber deep and clear deep and clear   Iris round and dilated, no NVI round and dilated, no NVI   Lens 2-3+ NS with early brunescense, 2+ cortical 2-3+ NS with early brunescense, 2+ cortical   Vitreous mild syneresis mild syneresis       Fundus Exam      Right Left   Disc pink and sharp pink and sharp, mild tilt, temp PPA   C/D Ratio 0.4 0.5   Macula flat, blunted foveal reflex, scattered MA, focal punctate exudates temporal macula, scattered cystic changes blunted foveal reflex, scattered MA and cystic changes   Vessels Vascular attenuation, Tortuous, mild A/V crossing changes Vascular attenuation, Tortuous, mild A/V crossing changes   Periphery Attached, scattered 360 MA/DBH greatest posteriorly  Attached, scattered 360 MA/DBH greatest posteriorly           IMAGING AND PROCEDURES  Imaging and Procedures for @TODAY @  OCT, Retina - OU - Both Eyes       Right Eye Quality was good. Central Foveal Thickness: 307. Progression has no prior data. Findings include normal foveal contour, no SRF, intraretinal hyper-reflective material, intraretinal fluid (Patches of DME inferior macula; partial PVD).   Left Eye Quality was good. Central Foveal Thickness: 302. Progression has no prior data. Findings include normal foveal contour, no SRF, intraretinal hyper-reflective material, intraretinal fluid (Scattered patches of DME; partial PVD).   Notes *Images captured and stored on drive  Diagnosis / Impression:  DME OU, partial PVD OU   Clinical  management:  See below  Abbreviations: NFP - Normal foveal profile. CME - cystoid macular edema. PED - pigment epithelial detachment. IRF - intraretinal fluid. SRF - subretinal fluid. EZ - ellipsoid zone. ERM - epiretinal membrane. ORA - outer retinal atrophy. ORT - outer retinal tubulation. SRHM - subretinal hyper-reflective material        Fluorescein Angiography Optos (Transit OS)       Right Eye   Early phase findings include microaneurysm, vascular perfusion defect. Mid/Late phase findings include microaneurysm, vascular perfusion defect, leakage.   Left Eye   Early phase findings include microaneurysm, vascular perfusion defect. Mid/Late phase findings include microaneurysm, vascular perfusion defect, leakage (No NV).   Notes **Images stored on drive**  Impression: Severe NPDR OU Late leaking MA OU Late peripheral leakage OU No NV OU                ASSESSMENT/PLAN:    ICD-10-CM   1. 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 Retinal edema  H35.81 OCT, Retina - OU - Both Eyes  3. Essential hypertension  I10   4. Hypertensive retinopathy of both eyes  H35.033 Fluorescein Angiography Optos (Transit OS)  5. Posterior vitreous detachment of both eyes  H43.813   6. Combined forms of age-related cataract of both eyes  H25.813     1,2. Severe NPDR with DME OU - The incidence, risk factors for progression, natural history and treatment options for diabetic retinopathy  were discussed with patient.   - The need for close monitoring of blood glucose, blood pressure, and serum lipids, avoiding cigarette or any type of tobacco, and the need for long term follow up was also discussed with patient. - BCVA is 20/20-1 OD and 20/30-2 OS today, 01.25.21 - FA shows late leaking MA and peripheral vascular perfusion defects OU -- no NV OU - OCT  shows mild diabetic macular edema OU  (OS > OD) The natural history, pathology, and characteristics of diabetic macular edema discussed with patient.  A generalized discussion of the major clinical trials concerning treatment of diabetic macular edema (ETDRS, DCT, SCORE, RISE / RIDE, and ongoing DRCR net studies) was completed.  This discussion included mention of the various approaches to treating diabetic macular edema (observation, laser photocoagulation, anti-VEGF injections with lucentis / Avastin / Eylea, steroid injections with Kenalog / Ozurdex, and intraocular surgery with vitrectomy).  The goal hemoglobin A1C of 6-7 was discussed, as well as importance of smoking cessation and hypertension control.  Need for ongoing treatment and monitoring were specifically discussed with reference to chronic nature of diabetic macular edema. - discussed findings with pt -- pt is s/p bariatric surgery in Sept 2020 - pt reports vast improvement in glycemic control and insulin requirements post bariatric surgery - thus it is unclear if today's edema is improving, stable or worsening - given VA is quite good, recommend holding tx for DME for now - f/u in 4-6 wks, sooner prn -- DFE/OCT/possible tx for DME  3,4. Hypertensive retinopathy OU - discussed importance of tight BP control - monitor  5. PVD / vitreous syneresis OU  Discussed findings and prognosis  No RT or RD on 360 scleral depressed exam  Reviewed s/s of RT/RD  Strict return precautions for any such RT/RD signs/symptoms  6. Age related cataracts OU - The symptoms of cataract, surgical options, and treatments and risks were discussed with patient. - discussed diagnosis and progression - not yet visually significant - monitor for now     Ophthalmic Meds Ordered this visit:  No orders of the defined types were placed in this encounter.      Return 4-6 weeks, for DFE, OCT.  There are no Patient Instructions on file for this visit.   Explained the diagnoses, plan, and  follow up with the patient and they expressed understanding.  Patient expressed understanding of the importance of proper follow up care.   This document serves as a record of services personally performed by Gardiner Sleeper, MD, PhD. It was created on their behalf by Roselee Nova, COMT. The creation of this record is the provider's dictation and/or activities during the visit.  Electronically signed by: Roselee Nova, COMT 10/21/19 4:09 PM  Gardiner Sleeper, M.D., Ph.D. Diseases & Surgery of the Retina and Vitreous [facility]  I have reviewed the above documentation for accuracy and completeness, and I agree with the above. Gardiner Sleeper, M.D., Ph.D. 10/21/19 4:09 PM   Abbreviations: M myopia (nearsighted); A astigmatism; H hyperopia (farsighted); P presbyopia; Mrx spectacle prescription;  CTL contact lenses; OD right eye; OS left eye; OU both eyes  XT exotropia; ET esotropia; PEK punctate epithelial keratitis; PEE punctate epithelial erosions; DES dry eye syndrome; MGD meibomian gland dysfunction; ATs artificial tears; PFAT's preservative free artificial tears; [place] nuclear sclerotic cataract; PSC posterior subcapsular cataract; ERM epi-retinal membrane; PVD posterior vitreous detachment; RD retinal detachment; DM diabetes mellitus; DR diabetic retinopathy; NPDR non-proliferative diabetic retinopathy; PDR proliferative diabetic retinopathy; CSME clinically significant macular edema; DME diabetic macular edema; dbh dot blot hemorrhages; CWS cotton wool spot; POAG primary open angle glaucoma; C/D cup-to-disc ratio; HVF humphrey visual field; GVF goldmann visual field; OCT optical coherence tomography; IOP intraocular pressure; BRVO Branch retinal vein occlusion; CRVO central retinal vein occlusion; CRAO central retinal artery occlusion; BRAO branch retinal artery occlusion; RT retinal tear; SB scleral  buckle; PPV pars plana vitrectomy; VH Vitreous hemorrhage; PRP panretinal laser  photocoagulation; IVK intravitreal kenalog; VMT vitreomacular traction; MH Macular hole;  NVD neovascularization of the disc; NVE neovascularization elsewhere; AREDS age related eye disease study; ARMD age related macular degeneration; POAG primary open angle glaucoma; EBMD epithelial/anterior basement membrane dystrophy; ACIOL anterior chamber intraocular lens; IOL intraocular lens; PCIOL posterior chamber intraocular lens; Phaco/IOL phacoemulsification with intraocular lens placement; [place] photorefractive keratectomy; LASIK laser assisted in situ keratomileusis; HTN hypertension; DM diabetes mellitus; COPD chronic obstructive pulmonary disease

## 2019-10-21 ENCOUNTER — Encounter (INDEPENDENT_AMBULATORY_CARE_PROVIDER_SITE_OTHER): Payer: Self-pay | Admitting: Ophthalmology

## 2019-10-21 ENCOUNTER — Ambulatory Visit (INDEPENDENT_AMBULATORY_CARE_PROVIDER_SITE_OTHER): Payer: Medicare HMO | Admitting: Ophthalmology

## 2019-10-21 ENCOUNTER — Other Ambulatory Visit: Payer: Self-pay

## 2019-10-21 DIAGNOSIS — H43813 Vitreous degeneration, bilateral: Secondary | ICD-10-CM | POA: Diagnosis not present

## 2019-10-21 DIAGNOSIS — H3581 Retinal edema: Secondary | ICD-10-CM | POA: Diagnosis not present

## 2019-10-21 DIAGNOSIS — H35033 Hypertensive retinopathy, bilateral: Secondary | ICD-10-CM | POA: Diagnosis not present

## 2019-10-21 DIAGNOSIS — E103413 Type 1 diabetes mellitus with severe nonproliferative diabetic retinopathy with macular edema, bilateral: Secondary | ICD-10-CM | POA: Diagnosis not present

## 2019-10-21 DIAGNOSIS — I10 Essential (primary) hypertension: Secondary | ICD-10-CM

## 2019-10-21 DIAGNOSIS — H25813 Combined forms of age-related cataract, bilateral: Secondary | ICD-10-CM

## 2019-10-22 ENCOUNTER — Ambulatory Visit: Payer: Medicare Other | Admitting: Skilled Nursing Facility1

## 2019-10-25 ENCOUNTER — Ambulatory Visit: Payer: Medicare Other

## 2019-10-31 ENCOUNTER — Ambulatory Visit: Payer: Medicare HMO | Attending: Family Medicine

## 2019-10-31 DIAGNOSIS — Z23 Encounter for immunization: Secondary | ICD-10-CM | POA: Insufficient documentation

## 2019-10-31 NOTE — Progress Notes (Signed)
   Covid-19 Vaccination Clinic  Name:  Christine Mcknight    MRN: [id number] DOB: Nov 07, 1945  10/31/2019  [name] was observed post Covid-19 immunization for 15 minutes without incidence. She was provided with Vaccine Information Sheet and instruction to access the V-Safe system.   [name] was instructed to call 911 with any severe reactions post vaccine: Marland Kitchen Difficulty breathing  . Swelling of your face and throat  . A fast heartbeat  . A bad rash all over your body  . Dizziness and weakness    Immunizations Administered    Name Date Dose VIS Date Route   Pfizer COVID-19 Vaccine 10/31/2019  9:27 AM 0.3 mL 09/06/2019 Intramuscular   Manufacturer: [place]   Lot: [id number]   [place]: [id number]

## 2019-11-04 ENCOUNTER — Encounter: Payer: Medicare HMO | Attending: Surgery | Admitting: Skilled Nursing Facility1

## 2019-11-04 ENCOUNTER — Other Ambulatory Visit: Payer: Self-pay

## 2019-11-04 DIAGNOSIS — E669 Obesity, unspecified: Secondary | ICD-10-CM | POA: Insufficient documentation

## 2019-11-04 DIAGNOSIS — E108 Type 1 diabetes mellitus with unspecified complications: Secondary | ICD-10-CM | POA: Insufficient documentation

## 2019-11-04 NOTE — Progress Notes (Signed)
Bariatric Nutrition Follow-Up Visit Medical Nutrition Therapy   Post-Operative Sleeve Surgery Surgery Date: 06/25/19   NUTRITION ASSESSMENT   Anthropometrics  Start weight at [facility]: 262.2 lbs (date: 05/30/2019) Today's weight: 235.6 lbs   Body Composition Scale 07/16/19 08/20/2019 09/16/2019 11/04/2019  Total Body Fat % 48.4 47.7 47.1 46.7  Visceral Fat 21 20 19 18   Fat-Free Mass % 51.5 52.2 52.8 53.2   Total Body Water % 40.2 40.6 40.9 41.1   Muscle-Mass lbs 28.5 28.4 28.3 28.3  Body Fat Displacement             Torso  lbs 76.3 72.8 70.1 68.2         Left Leg  lbs 15.2 14.5 14 13.6         Right Leg  lbs 15.2 14.5 14 13.6         Left Arm  lbs 7.6 7.2 7 6.8         Right Arm   lbs 7.6 7.2 7 6.8   Clinical  Medical hx: Type 1 DM Medications: Insulin Labs:    Lifestyle & Dietary Hx  Pt states she is still struggling to reach her 64 fluid ounce goal. Pt states she does not like to eat right when she gets up so she could drink before her coffee. Pt states she had an appointment with her endocrinologist and got a thyroid medication change. Pt reports that she will return to the endocrinologist in 3 months. She reports that she is able to recognize physical symptoms of hypoglycemia which she corrects with food such as cereal. Pt states her A1C is 9. Pt states she had 2 lows in the last month: both were 55, in the night around 4am feeling shaky and sweaty; ate a bowl of cereal and went to bed with a BS of 259 when she awoke later. Pt states she corrects with a lower dose of insulin than prescribed stating her endocrinologist is aware of this.  Pt was unable to recall these days. Pt states she decreased her long acting insulin which she attributes to having less lows occurring.  Pt states she eats everything now with some things like hamburger bothering her stomach. Pt states whey based protein shakes give her diarrhea. Pt states she does not like non starchy vegetables but she makes herself  eat them.  Pt states her daughter is taking medication that is working so the pt is less stressed. Pt [place] she has let that go.    Estimated daily fluid intake: 50 oz Estimated daily protein intake: 60+ g Supplements: opurity and calcium  Current average weekly physical activity: 20 minutes 2-3 days a week   24-Hr Dietary Recall: 2 sugar free pudding a day First Meal: decaff coffee and then half hour later half protein shake or 2 pieces of toast with butter Snack:  Second Meal: pack of tuna or chicken or cottage cheese with sugar free jello or canned tomato soup with crackers  Snack: hot tea with milk and sugar Third Meal: meatball with broccoli or aspargus or small hamburger with ketchup or pot roast with potatoes or salad  Snack: sugar free hot chocolate with lactaid milk or sugar free pudding or sugar free popcicle or cheese stick or cottage cheese and jello or yogurt Beverages: decaf coffee, sugar free hot chocolate with lactaid milk, water, hot tea  Post-Op Goals/ Signs/ Symptoms Using straws: no Drinking while eating: no Chewing/swallowing difficulties:  no Changes in vision: no Changes to mood/headaches: no Hair loss/changes to skin/nails: no Difficulty focusing/concentrating: no Sweating: no Dizziness/lightheadedness: no Palpitations: no  Carbonated/caffeinated beverages: no N/V/D/C/Gas: bowel movement every other day to every 2 days  Abdominal pain: no Dumping syndrome: no    NUTRITION DIAGNOSIS  Overweight/obesity ([place]-3.3) related to past poor dietary habits and physical inactivity as evidenced by completed bariatric surgery and following dietary guidelines for continued weight loss and healthy nutrition status.     NUTRITION INTERVENTION Nutrition counseling (C-1) and education (E-2) to facilitate bariatric surgery goals, including: . The importance of consuming adequate calories as well as certain nutrients daily due  to the body's need for essential vitamins, minerals, and fats . The importance of daily physical activity and to reach a goal of at least 150 minutes of moderate to vigorous physical activity weekly (or as directed by their physician) due to benefits such as increased musculature and improved lab values  Goals: Work on Associate Professor meals Eat a breakfast with protein and carbohydrate: 1 slice of toast with peanut butter and maybe 1 egg on the side or cottage cheese with fruit (not in syrup) or 2/3 cup cereal Start drinking something right when you wake up before your coffee Aim to get your non starchy vegetable sin 2 times a day 7 days a week  Be sure to have portion sizes of carb and protein allowing for room for non starchy vegetables  Diabetes food hub for recipes Aim for 4 days a week at 20 minutes   Learning Style & Readiness for Change Teaching method utilized: Visual & Auditory  Demonstrated degree of understanding via: Teach Back  Barriers to learning/adherence to lifestyle change:   RD's Notes for Next Visit Assess adherence to pt chosen goals Check in on housing status  MONITORING & EVALUATION 2 months

## 2019-11-15 ENCOUNTER — Ambulatory Visit: Payer: Medicare Other

## 2019-11-25 ENCOUNTER — Ambulatory Visit: Payer: Medicare HMO | Attending: Internal Medicine

## 2019-11-25 DIAGNOSIS — Z23 Encounter for immunization: Secondary | ICD-10-CM | POA: Insufficient documentation

## 2019-11-25 NOTE — Progress Notes (Signed)
   Covid-19 Vaccination Clinic  Name:  Christine Mcknight    MRN: [id number] DOB: 04-Jun-1946  11/25/2019  [name] was observed post Covid-19 immunization for 15 minutes without incidence. She was provided with Vaccine Information Sheet and instruction to access the V-Safe system.   [name] was instructed to call 911 with any severe reactions post vaccine: Marland Kitchen Difficulty breathing  . Swelling of your face and throat  . A fast heartbeat  . A bad rash all over your body  . Dizziness and weakness    Immunizations Administered    Name Date Dose VIS Date Route   Pfizer COVID-19 Vaccine 11/25/2019 12:15 PM 0.3 mL 09/06/2019 Intramuscular   Manufacturer: [place]   Lot: [id number]   [place]: [id number]

## 2019-12-02 ENCOUNTER — Encounter (INDEPENDENT_AMBULATORY_CARE_PROVIDER_SITE_OTHER): Payer: Medicare HMO | Admitting: Ophthalmology

## 2019-12-24 DIAGNOSIS — N950 Postmenopausal bleeding: Secondary | ICD-10-CM | POA: Diagnosis not present

## 2019-12-26 DIAGNOSIS — C541 Malignant neoplasm of endometrium: Secondary | ICD-10-CM | POA: Diagnosis not present

## 2019-12-26 DIAGNOSIS — N950 Postmenopausal bleeding: Secondary | ICD-10-CM | POA: Diagnosis not present

## 2020-01-07 ENCOUNTER — Other Ambulatory Visit: Payer: Self-pay

## 2020-01-07 ENCOUNTER — Telehealth: Payer: Self-pay

## 2020-01-07 ENCOUNTER — Encounter: Payer: Self-pay | Admitting: Gynecologic Oncology

## 2020-01-07 ENCOUNTER — Other Ambulatory Visit: Payer: Self-pay | Admitting: Gynecologic Oncology

## 2020-01-07 ENCOUNTER — Inpatient Hospital Stay: Payer: Medicare HMO | Attending: Gynecologic Oncology | Admitting: Gynecologic Oncology

## 2020-01-07 ENCOUNTER — Inpatient Hospital Stay: Payer: Medicare HMO

## 2020-01-07 VITALS — BP 123/58 | HR 78 | Temp 97.8°F | Resp 18 | Ht 65.0 in | Wt 232.6 lb

## 2020-01-07 DIAGNOSIS — C541 Malignant neoplasm of endometrium: Secondary | ICD-10-CM

## 2020-01-07 DIAGNOSIS — E109 Type 1 diabetes mellitus without complications: Secondary | ICD-10-CM

## 2020-01-07 DIAGNOSIS — E108 Type 1 diabetes mellitus with unspecified complications: Secondary | ICD-10-CM | POA: Insufficient documentation

## 2020-01-07 DIAGNOSIS — Z9884 Bariatric surgery status: Secondary | ICD-10-CM | POA: Insufficient documentation

## 2020-01-07 DIAGNOSIS — M797 Fibromyalgia: Secondary | ICD-10-CM | POA: Diagnosis not present

## 2020-01-07 DIAGNOSIS — F419 Anxiety disorder, unspecified: Secondary | ICD-10-CM | POA: Insufficient documentation

## 2020-01-07 DIAGNOSIS — E039 Hypothyroidism, unspecified: Secondary | ICD-10-CM | POA: Diagnosis not present

## 2020-01-07 DIAGNOSIS — R69 Illness, unspecified: Secondary | ICD-10-CM | POA: Diagnosis not present

## 2020-01-07 DIAGNOSIS — Z794 Long term (current) use of insulin: Secondary | ICD-10-CM | POA: Insufficient documentation

## 2020-01-07 DIAGNOSIS — N950 Postmenopausal bleeding: Secondary | ICD-10-CM | POA: Diagnosis not present

## 2020-01-07 DIAGNOSIS — Z6839 Body mass index (BMI) 39.0-39.9, adult: Secondary | ICD-10-CM | POA: Diagnosis not present

## 2020-01-07 DIAGNOSIS — M1990 Unspecified osteoarthritis, unspecified site: Secondary | ICD-10-CM | POA: Diagnosis not present

## 2020-01-07 DIAGNOSIS — Z79899 Other long term (current) drug therapy: Secondary | ICD-10-CM | POA: Diagnosis not present

## 2020-01-07 DIAGNOSIS — E669 Obesity, unspecified: Secondary | ICD-10-CM | POA: Diagnosis not present

## 2020-01-07 DIAGNOSIS — F329 Major depressive disorder, single episode, unspecified: Secondary | ICD-10-CM | POA: Insufficient documentation

## 2020-01-07 DIAGNOSIS — I10 Essential (primary) hypertension: Secondary | ICD-10-CM | POA: Insufficient documentation

## 2020-01-07 DIAGNOSIS — Z8041 Family history of malignant neoplasm of ovary: Secondary | ICD-10-CM | POA: Insufficient documentation

## 2020-01-07 DIAGNOSIS — G4733 Obstructive sleep apnea (adult) (pediatric): Secondary | ICD-10-CM | POA: Insufficient documentation

## 2020-01-07 LAB — COMPREHENSIVE METABOLIC PANEL
ALT: 21 U/L (ref 0–44)
AST: 19 U/L (ref 15–41)
Albumin: 3.7 g/dL (ref 3.5–5.0)
Alkaline Phosphatase: 128 U/L — ABNORMAL HIGH (ref 38–126)
Anion gap: 9 (ref 5–15)
BUN: 16 mg/dL (ref 8–23)
CO2: 28 mmol/L (ref 22–32)
Calcium: 9 mg/dL (ref 8.9–10.3)
Chloride: 101 mmol/L (ref 98–111)
Creatinine, Ser: 1.12 mg/dL — ABNORMAL HIGH (ref 0.44–1.00)
GFR calc Af Amer: 57 mL/min — ABNORMAL LOW (ref >60)
GFR calc non Af Amer: 49 mL/min — ABNORMAL LOW (ref >60)
Glucose, Bld: 273 mg/dL — ABNORMAL HIGH (ref 70–99)
Potassium: 3.9 mmol/L (ref 3.5–5.1)
Sodium: 138 mmol/L (ref 135–145)
Total Bilirubin: 0.3 mg/dL (ref 0.3–1.2)
Total Protein: 7.1 g/dL (ref 6.5–8.1)

## 2020-01-07 MED ORDER — SENNOSIDES-DOCUSATE SODIUM 8.6-50 MG PO TABS: 2.0000 | ORAL_TABLET | Freq: Every day | ORAL | 1 refills | Status: DC

## 2020-01-07 MED ORDER — IBUPROFEN 600 MG PO TABS: 600.0000 mg | ORAL_TABLET | Freq: Four times a day (QID) | ORAL | 0 refills | Status: DC | PRN

## 2020-01-07 MED ORDER — TRAMADOL HCL 50 MG PO TABS: 50.0000 mg | ORAL_TABLET | Freq: Four times a day (QID) | ORAL | 0 refills | Status: DC | PRN

## 2020-01-07 NOTE — Patient Instructions (Addendum)
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  We will also have a Cmet (metabolic panel) to check your kidney function prior to your CT scan and a hemoglobin A1C today.   Preparing for your Surgery  Plan for surgery on January 14, 2020 with Dr. Everitt Amber at [place] will be scheduled for a robotic assisted total laparoscopic hysterectomy (removal of uterus and cervix), bilateral salpingo-oophorectomy (removal of fallopian tubes and ovaries), sentinel lymph node biopsy, possible lymph node dissection.   Pre-operative Testing -You will receive a phone call from presurgical testing at [facility] to arrange for a pre-operative appointment over the phone, lab appointment, and COVID test. The COVID test normally happens 3 days prior to your procedure and they ask that you self quarantine yourself up until the surgery to decrease chance of exposure/infection.  -Bring your insurance card, copy of an advanced directive if applicable, medication list  -At that visit, you will be asked to sign a consent for a possible blood transfusion in case a transfusion becomes necessary during surgery.  The need for a blood transfusion is rare but having consent is a necessary part of your care.     -You should not be taking blood thinners or aspirin at least ten days prior to surgery unless instructed by your surgeon.  -Do not take supplements such as fish oil (omega 3), red yeast rice, tumeric before your surgery.   Day Before Surgery at [place] will be asked to take in a light diet the day before surgery.  Avoid carbonated beverages.  You will be advised to have nothing to eat or drink after midnight the evening before.    Eat a light diet the day before surgery.  Examples including soups, broths, toast, yogurt, mashed potatoes.  Things to avoid include carbonated beverages (fizzy beverages), raw fruits and raw vegetables, or  beans.   If your bowels are filled with gas, your surgeon will have difficulty visualizing your pelvic organs which increases your surgical risks.  Your role in recovery Your role is to become active as soon as directed by your doctor, while still giving yourself time to heal.  Rest when you feel tired. You will be asked to do the following in order to speed your recovery:  - Cough and breathe deeply. This helps to clear and expand your lungs and can prevent pneumonia after surgery.  - [place]. Do mild physical activity. Walking or moving your legs help your circulation and body functions return to normal. Do not try to get up or walk alone the first time after surgery.   -If you develop swelling on one leg or the other, pain in the back of your leg, redness/warmth in one of your legs, please call the office or go to the Emergency Room to have a doppler to rule out a blood clot. For shortness of breath, chest pain-seek care in the Emergency Room as soon as possible. - Actively manage your pain. Managing your pain lets you move in comfort. We will ask you to rate your pain on a scale of zero to 10. It is your responsibility to tell your doctor or nurse where and how much you hurt so your pain can be treated.  Special Considerations -If you are diabetic, you may be placed on insulin after surgery to have closer control over your blood sugars to  promote healing and recovery.  This does not mean that you will be discharged on insulin.  If applicable, your oral antidiabetics will be resumed when you are tolerating a solid diet.  -Your final pathology results from surgery should be available around one week after surgery and the results will be relayed to you when available.  -Dr. Lahoma Crocker is the surgeon that assists your GYN Oncologist with surgery.  If you end up staying the night, the next day after your surgery you will either see Dr. Denman George, Dr. Berline Lopes, or Dr. Lahoma Crocker.  -FMLA forms can be faxed to [phone] and please allow 5-7 business days for completion.  Pain Management After Surgery -You have been prescribed your pain medication and bowel regimen medications before surgery so that you can have these available when you are discharged from the hospital. The pain medication is for use ONLY AFTER surgery and a new prescription will not be given.   -Make sure that you have Tylenol and Ibuprofen at home to use on a regular basis after surgery for pain control. We recommend alternating the medications every hour to six hours since they work differently and are processed in the body differently for pain relief.  -Review the attached handout on narcotic use and their risks and side effects.   Bowel Regimen -You have been prescribed Sennakot-S to take nightly to prevent constipation especially if you are taking the narcotic pain medication intermittently.  It is important to prevent constipation and drink adequate amounts of liquids. You can stop taking this medication when you are not taking pain medication and you are back on your normal bowel routine.   Blood Transfusion Information (For the consent to be signed before surgery)  We will be checking your blood type before surgery so in case of emergencies, we will know what type of blood you would need.                                            WHAT IS A BLOOD TRANSFUSION?  A transfusion is the replacement of blood or some of its parts. Blood is made up of multiple cells which provide different functions.  Red blood cells carry oxygen and are used for blood loss replacement.  White blood cells fight against infection.  Platelets control bleeding.  Plasma helps clot blood.  Other blood products are available for specialized needs, such as hemophilia or other clotting disorders. BEFORE THE TRANSFUSION  Who gives blood for transfusions?   You may be able to donate blood to be used at  a later date on yourself (autologous donation).  Relatives can be asked to donate blood. This is generally not any safer than if you have received blood from a stranger. The same precautions are taken to ensure safety when a relative's blood is donated.  Healthy volunteers who are fully evaluated to make sure their blood is safe. This is blood bank blood. Transfusion therapy is the safest it has ever been in the practice of medicine. Before blood is taken from a donor, a complete history is taken to make sure that person has no history of diseases nor engages in risky social behavior (examples are intravenous drug use or sexual activity with multiple partners). The donor's travel history is screened to minimize risk of transmitting infections, such as malaria. The donated blood is tested for signs  of infectious diseases, such as HIV and hepatitis. The blood is then tested to be sure it is compatible with you in order to minimize the chance of a transfusion reaction. If you or a relative donates blood, this is often done in anticipation of surgery and is not appropriate for emergency situations. It takes many days to process the donated blood. RISKS AND COMPLICATIONS Although transfusion therapy is very safe and saves many lives, the main dangers of transfusion include:   Getting an infectious disease.  Developing a transfusion reaction. This is an allergic reaction to something in the blood you were given. Every precaution is taken to prevent this. The decision to have a blood transfusion has been considered carefully by your caregiver before blood is given. Blood is not given unless the benefits outweigh the risks.  AFTER SURGERY INSTRUCTIONS  Return to work: 4-6 weeks if applicable  Activity: 1. Be up and out of the bed during the day.  Take a nap if needed.  You may walk up steps but be careful and use the hand rail.  Stair climbing will tire you more than you think, you may need to stop part  way and rest.   2. No lifting or straining for 6 weeks over 10 pounds. No pushing, pulling, straining for 6 weeks.  3. No driving for 1 week(s).  Do not drive if you are taking narcotic pain medicine.   4. You can shower as soon as the next day after surgery. Shower daily.  Use soap and water on your incision and pat dry; don't rub.  No tub baths or submerging your body in water until cleared by your surgeon. If you have the soap that was given to you by pre-surgical testing that was used before surgery, you do not need to use it afterwards because this can irritate your incisions.   5. No sexual activity and nothing in the vagina for 8 weeks.  6. You may experience a small amount of clear drainage from your incisions, which is normal.  If the drainage persists, increases, or changes color please call the office.  7. Do not use creams, lotions, or ointments such as neosporin on your incisions after surgery until advised by your surgeon because they can cause removal of the dermabond glue on your incisions.    8. You may experience vaginal spotting after surgery or around the 6-8 week mark from surgery when the stitches at the top of the vagina begin to dissolve.  The spotting is normal but if you experience heavy bleeding, call our office.  9. Take Tylenol or ibuprofen first for pain and only use narcotic pain medication for severe pain not relieved by the Tylenol or Ibuprofen.  Monitor your Tylenol intake to a max of 4,000 mg.  Diet: 1. Low sodium Heart Healthy Diet is recommended.  2. It is safe to use a laxative, such as Miralax or Colace, if you have difficulty moving your bowels. You can take Sennakot at bedtime every evening to keep bowel movements regular and to prevent constipation.    Wound Care: 1. Keep clean and dry.  Shower daily.  Reasons to call the Doctor:  Fever - Oral temperature greater than 100.4 degrees Fahrenheit  Foul-smelling vaginal discharge  Difficulty  urinating  Nausea and vomiting  Increased pain at the site of the incision that is unrelieved with pain medicine.  Difficulty breathing with or without chest pain  New calf pain especially if only on one side  Sudden, continuing increased vaginal bleeding with or without clots.   Contacts: For questions or concerns you should contact:  Dr. Everitt Amber at [phone]  Joylene John, NP at [phone]  After Hours: call [phone] and have the GYN Oncologist paged/contacted

## 2020-01-07 NOTE — Telephone Encounter (Signed)
Told Christine Mcknight that her creatine today was a little elevated at 1.12 per Melissa Cross,NP.  She should not use the ibuprofen 600 mg every 6 hours ATC after her surgery.  Melissa said to use intermittently since kidney function elevated. Encouraged patient to drink at least 64 oz of decaffinated fluid a day to help stay hydrated. Christine Meuser said that she is seeing her endocrinologist on Thursday 01-09-20 at [place]. Will fax Dr. Serita Grit office note, C-met, and Hgb A1c from today 01-07-20 when available. Pt verbalized understanding.

## 2020-01-07 NOTE — Progress Notes (Signed)
Consult Note: Gyn-Onc  Consult was requested by Dr. Nelda Marseille for the evaluation of Christine Mcknight 74 y.o. female  CC:  Chief Complaint  Patient presents with  . Endometrial cancer [facility])    new patient    Assessment/Plan:  Ms. Christine Mcknight  is a 74 y.o.  year old with FIGO grade 3 endometrioid endometrial cancer.  We will obtain a CT abd/pelvis/chest preoperatively to evaluate for metastatic disease.  A detailed discussion was held with the patient and her family with regard to to her endometrial cancer diagnosis. We discussed the standard management options for uterine cancer which includes surgery followed possibly by adjuvant therapy depending on the results of surgery. The options for surgical management include a hysterectomy and removal of the tubes and ovaries possibly with removal of pelvic and para-aortic lymph nodes.If feasible, a minimally invasive approach including a robotic hysterectomy or laparoscopic hysterectomy have benefits including shorter hospital stay, recovery time and better wound healing than with open surgery. The patient has been counseled about these surgical options and the risks of surgery in general including infection, bleeding, damage to surrounding structures (including bowel, bladder, ureters, nerves or vessels), and the postoperative risks of PE/ DVT, and lymphedema. I extensively reviewed the additional risks of robotic hysterectomy including possible need for conversion to open laparotomy.  I discussed positioning during surgery of trendelenberg and risks of minor facial swelling and care we take in preoperative positioning.  After counseling and consideration of her options, she desires to proceed with robotic assisted total hysterectomy with bilateral sapingo-oophorectomy and SLN biopsy.   She will be seen by anesthesia for preoperative clearance and discussion of postoperative pain management.  She was given the opportunity to ask questions, which were answered  to her satisfaction, and she is agreement with the above mentioned plan of care.  I explained that due to the high grade nature of her tumor, postop adjuvant therapy is likely necessary beyond just surgery.  HPI: [name] is a 74 year old P2 who was seen in consultation at the request of Dr Nelda Marseille for evaluation of endometrial cancer (FIGO grade 3).  The patient reported a single episode of postmenopausal bleeding in the middle of the night on approximately March 26th, 2021.  She then saw her primary care physician the following day who scheduled her to see Dr.  Nelda Marseille within the week for an evaluation including ultrasound and endometrial biopsy.  When Dr. Nelda Marseille evaluated the patient she proceeded with an endometrial Pipelle biopsy due to concern for occult malignancy.  This was performed on December 26, 2019.  This biopsy returned as FIGO grade 3 endometrioid adenocarcinoma.  The patient subsequently had no further bleeding.  The patient has an ultrasound scheduled that she will have at [place] on the same day as this consultation.  The patient's medical history is remarkable for being a type I diabetic.  This is complicated by retinopathy.  As far she is aware she does not have a history of nephropathy or neuropathy.  She sees Dr. Buddy Duty an endocrinologist for close blood glucose control.  Her most recent hemoglobin A1c was 9% approximately 6 months ago.  The patient had a history of a gastric sleeve in 2020 performed laparoscopically with Dr. Josie Saunders.  This was an uncomplicated procedure and resulted in a 40 pound weight loss.  Her additional surgical history is remarkable for 2 prior cesarean sections.  Her medical history is also significant for fibromyalgia, hypothyroidism, obstructive sleep apnea for which  she uses a CPAP device, and depression and anxiety.  She is obese with a BMI of 39 kg per metered squared.  Her family history is remarkable for a mother with ovarian cancer in her early  46s which was stage IV she required chemotherapy and developed myelodysplasia.  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  She lives with her daughter who is of good health.  Current Meds:  Outpatient Encounter Medications as of 01/07/2020  Medication Sig  . Cholecalciferol (VITAMIN D) 50 MCG (2000 UT) tablet Take 2,000 Units by mouth daily.   . DULoxetine (CYMBALTA) 60 MG capsule Take 60 mg by mouth daily.  Marland Kitchen FIASP FLEXTOUCH 100 UNIT/ML SOPN Inject 10-20 Units into the skin 3 (three) times daily as needed (blood sugar 200 and over).   . insulin degludec (TRESIBA FLEXTOUCH) 100 UNIT/ML SOPN FlexTouch Pen Inject 0.4 mLs (40 Units total) into the skin daily.  Marland Kitchen losartan (COZAAR) 25 MG tablet Take 1 tablet (25 mg total) by mouth daily.  . pantoprazole (PROTONIX) 40 MG tablet Take 1 tablet (40 mg total) by mouth daily.  . pravastatin (PRAVACHOL) 20 MG tablet Take 20 mg by mouth daily.  Marland Kitchen SYNTHROID 150 MCG tablet Take 150 mcg by mouth every morning.  Marland Kitchen ibuprofen (ADVIL) 600 MG tablet Take 1 tablet (600 mg total) by mouth every 6 (six) hours as needed for moderate pain. For AFTER surgery only  . senna-docusate (SENOKOT-S) 8.6-50 MG tablet Take 2 tablets by mouth at bedtime. For AFTER surgery, do not take if having diarrhea  . traMADol (ULTRAM) 50 MG tablet Take 1 tablet (50 mg total) by mouth every 6 (six) hours as needed for severe pain. For AFTER surgery, do not take and drive  . [DISCONTINUED] levothyroxine (SYNTHROID) 125 MCG tablet Take 125 mcg by mouth daily before breakfast.  . [DISCONTINUED] ondansetron (ZOFRAN-ODT) 4 MG disintegrating tablet Take 1 tablet (4 mg total) by mouth every 6 (six) hours as needed for nausea or vomiting. (Patient not taking: Reported on 10/21/2019)  . [DISCONTINUED] Polyethyl Glycol-Propyl Glycol (SYSTANE ULTRA OP) Place 1 drop into both eyes 2 (two) times daily as needed (dry eyes).  . [DISCONTINUED] sodium chloride (OCEAN) 0.65 %  SOLN nasal spray Place 1 spray into both nostrils as needed for congestion.  . [DISCONTINUED] traMADol (ULTRAM) 50 MG tablet Take 1 tablet (50 mg total) by mouth every 6 (six) hours as needed (pain). (Patient not taking: Reported on 10/21/2019)   No facility-administered encounter medications on file as of 01/07/2020.    Allergy: No Known Allergies  Social Hx:   Social History   Socioeconomic History  . Marital status: Single    Spouse name: Not on file  . Number of children: Not on file  . Years of education: Not on file  . Highest education level: Not on file  Occupational History  . Not on file  Tobacco Use  . Smoking status: Never Smoker  . Smokeless tobacco: Never Used  Substance and Sexual Activity  . Alcohol use: Not Currently    Comment: occasionally  . Drug use: Never  . Sexual activity: Not Currently  Other Topics Concern  . Not on file  Social History Narrative  . Not on file   Social Determinants of Health   Financial Resource Strain:   . Difficulty of Paying Living Expenses:   Food Insecurity:   . Worried About Charity fundraiser in the Last Year:   .  Ran Out of Food in the Last Year:   Transportation Needs:   . Film/video editor (Medical):   Marland Kitchen Lack of Transportation (Non-Medical):   Physical Activity:   . Days of Exercise per Week:   . Minutes of Exercise per Session:   Stress:   . Feeling of Stress :   Social Connections:   . Frequency of Communication with Friends and Family:   . Frequency of Social Gatherings with Friends and Family:   . Attends Religious Services:   . Active Member of Clubs or Organizations:   . Attends Archivist Meetings:   Marland Kitchen Marital Status:   Intimate Partner Violence:   . Fear of Current or Ex-Partner:   . Emotionally Abused:   Marland Kitchen Physically Abused:   . Sexually Abused:     Past Surgical Hx:  Past Surgical History:  Procedure Laterality Date  . CESAREAN SECTION     X2  . DILATION AND CURETTAGE OF  UTERUS     x 2  . LAPAROSCOPIC GASTRIC SLEEVE RESECTION N/A 06/25/2019   Procedure: LAPAROSCOPIC GASTRIC SLEEVE RESECTION WITH HIATAL HERNIA REPAIR AND UPPER ENDOSCOPY, ERAS Pathway;  Surgeon: Clovis Riley, MD;  Location: [facility];  Service: General;  Laterality: N/A;  . TONSILLECTOMY      Past Medical Hx:  Past Medical History:  Diagnosis Date  . Anxiety   . Arthritis   . Complication of anesthesia    tends to aspirate  . Depression   . Diabetes mellitus without complication ([place])    type 1  dexcom continous glucose meter  . Fibromyalgia   . Headache    history of migraines  . Hypertension   . Hypothyroidism   . PONV (postoperative nausea and vomiting)   . Sleep apnea    cpap    Past Gynecological History:  Menopause at age 67, 2 prior cesarean sections. No LMP recorded. Patient is postmenopausal.  Family Hx:  Family History  Problem Relation Age of Onset  . Cancer Other   . Diabetes Other   . Cataracts Mother   . Ovarian cancer Mother   . Cataracts Father     Review of Systems:  Constitutional  Feels well,    ENT Normal appearing ears and nares bilaterally Skin/Breast  No rash, sores, jaundice, itching, dryness Cardiovascular  No chest pain, shortness of breath, or edema  Pulmonary  No cough or wheeze.  Gastro Intestinal  No nausea, vomitting, or diarrhoea. No bright red blood per rectum, no abdominal pain, change in bowel movement, or constipation.  Genito Urinary  No frequency, urgency, dysuria, + postmenopausal bleeding Musculo Skeletal  No myalgia, arthralgia, joint swelling or pain  Neurologic  No weakness, numbness, change in gait,  Psychology  No depression, anxiety, insomnia.   Vitals:  Blood pressure (!) 123/58, pulse 78, temperature 97.8 F (36.6 C), temperature source Temporal, resp. rate 18, height 5\' 5"  (1.651 m), weight 232 lb 9.6 oz (105.5 kg), SpO2 99 %.  Physical Exam: WD in NAD Neck  Supple NROM, without any enlargements.   Lymph Node Survey No cervical supraclavicular or inguinal adenopathy Cardiovascular  Pulse normal rate, regularity and rhythm. S1 and S2 normal.  Lungs  Clear to auscultation bilateraly, without wheezes/crackles/rhonchi. Good air movement.  Skin  No rash/lesions/breakdown  Psychiatry  Alert and oriented to person, place, and time  Abdomen  Normoactive bowel sounds, abdomen soft, non-tender and obese without evidence of hernia but a diastasis is present in the epigastrium.  Back No CVA tenderness Genito Urinary  Vulva/vagina: Normal external female genitalia.  No lesions. No discharge or bleeding.  Bladder/urethra:  No lesions or masses, well supported bladder  Vagina: grossly normal  Cervix: Normal appearing, no lesions.  Uterus:  Small, mobile, no parametrial involvement or nodularity.  Adnexa: no palpable masses. Rectal  deferred Extremities  No bilateral cyanosis, clubbing or edema.   Thereasa Solo, MD  01/07/2020, 6:45 PM

## 2020-01-07 NOTE — H&P (View-Only) (Signed)
Consult Note: Gyn-Onc  Consult was requested by Dr. Nelda Marseille for the evaluation of Christine Mcknight 74 y.o. female  CC:  Chief Complaint  Patient presents with  . Endometrial cancer [facility])    new patient    Assessment/Plan:  Ms. Christine Mcknight  is a 74 y.o.  year old with FIGO grade 3 endometrioid endometrial cancer.  We will obtain a CT abd/pelvis/chest preoperatively to evaluate for metastatic disease.  A detailed discussion was held with the patient and her family with regard to to her endometrial cancer diagnosis. We discussed the standard management options for uterine cancer which includes surgery followed possibly by adjuvant therapy depending on the results of surgery. The options for surgical management include a hysterectomy and removal of the tubes and ovaries possibly with removal of pelvic and para-aortic lymph nodes.If feasible, a minimally invasive approach including a robotic hysterectomy or laparoscopic hysterectomy have benefits including shorter hospital stay, recovery time and better wound healing than with open surgery. The patient has been counseled about these surgical options and the risks of surgery in general including infection, bleeding, damage to surrounding structures (including bowel, bladder, ureters, nerves or vessels), and the postoperative risks of PE/ DVT, and lymphedema. I extensively reviewed the additional risks of robotic hysterectomy including possible need for conversion to open laparotomy.  I discussed positioning during surgery of trendelenberg and risks of minor facial swelling and care we take in preoperative positioning.  After counseling and consideration of her options, she desires to proceed with robotic assisted total hysterectomy with bilateral sapingo-oophorectomy and SLN biopsy.   She will be seen by anesthesia for preoperative clearance and discussion of postoperative pain management.  She was given the opportunity to ask questions, which were answered  to her satisfaction, and she is agreement with the above mentioned plan of care.  I explained that due to the high grade nature of her tumor, postop adjuvant therapy is likely necessary beyond just surgery.  HPI: [name] is a 74 year old P2 who was seen in consultation at the request of Dr Nelda Marseille for evaluation of endometrial cancer (FIGO grade 3).  The patient reported a single episode of postmenopausal bleeding in the middle of the night on approximately March 26th, 2021.  She then saw her primary care physician the following day who scheduled her to see Dr.  Nelda Marseille within the week for an evaluation including ultrasound and endometrial biopsy.  When Dr. Nelda Marseille evaluated the patient she proceeded with an endometrial Pipelle biopsy due to concern for occult malignancy.  This was performed on December 26, 2019.  This biopsy returned as FIGO grade 3 endometrioid adenocarcinoma.  The patient subsequently had no further bleeding.  The patient has an ultrasound scheduled that she will have at [facility] on the same day as this consultation.  The patient's medical history is remarkable for being a type I diabetic.  This is complicated by retinopathy.  As far she is aware she does not have a history of nephropathy or neuropathy.  She sees Dr. Buddy Duty an endocrinologist for close blood glucose control.  Her most recent hemoglobin A1c was 9% approximately 6 months ago.  The patient had a history of a gastric sleeve in 2020 performed laparoscopically with Dr. Josie Saunders.  This was an uncomplicated procedure and resulted in a 40 pound weight loss.  Her additional surgical history is remarkable for 2 prior cesarean sections.  Her medical history is also significant for fibromyalgia, hypothyroidism, obstructive sleep apnea for which  she uses a CPAP device, and depression and anxiety.  She is obese with a BMI of 39 kg per metered squared.  Her family history is remarkable for a mother with ovarian cancer in her early  1s which was stage IV she required chemotherapy and developed myelodysplasia.  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  She lives with her daughter who is of good health.  Current Meds:  Outpatient Encounter Medications as of 01/07/2020  Medication Sig  . Cholecalciferol (VITAMIN D) 50 MCG (2000 UT) tablet Take 2,000 Units by mouth daily.   . DULoxetine (CYMBALTA) 60 MG capsule Take 60 mg by mouth daily.  Marland Kitchen FIASP FLEXTOUCH 100 UNIT/ML SOPN Inject 10-20 Units into the skin 3 (three) times daily as needed (blood sugar 200 and over).   . insulin degludec (TRESIBA FLEXTOUCH) 100 UNIT/ML SOPN FlexTouch Pen Inject 0.4 mLs (40 Units total) into the skin daily.  Marland Kitchen losartan (COZAAR) 25 MG tablet Take 1 tablet (25 mg total) by mouth daily.  . pantoprazole (PROTONIX) 40 MG tablet Take 1 tablet (40 mg total) by mouth daily.  . pravastatin (PRAVACHOL) 20 MG tablet Take 20 mg by mouth daily.  Marland Kitchen SYNTHROID 150 MCG tablet Take 150 mcg by mouth every morning.  Marland Kitchen ibuprofen (ADVIL) 600 MG tablet Take 1 tablet (600 mg total) by mouth every 6 (six) hours as needed for moderate pain. For AFTER surgery only  . senna-docusate (SENOKOT-S) 8.6-50 MG tablet Take 2 tablets by mouth at bedtime. For AFTER surgery, do not take if having diarrhea  . traMADol (ULTRAM) 50 MG tablet Take 1 tablet (50 mg total) by mouth every 6 (six) hours as needed for severe pain. For AFTER surgery, do not take and drive  . [DISCONTINUED] levothyroxine (SYNTHROID) 125 MCG tablet Take 125 mcg by mouth daily before breakfast.  . [DISCONTINUED] ondansetron (ZOFRAN-ODT) 4 MG disintegrating tablet Take 1 tablet (4 mg total) by mouth every 6 (six) hours as needed for nausea or vomiting. (Patient not taking: Reported on 10/21/2019)  . [DISCONTINUED] Polyethyl Glycol-Propyl Glycol (SYSTANE ULTRA OP) Place 1 drop into both eyes 2 (two) times daily as needed (dry eyes).  . [DISCONTINUED] sodium chloride (OCEAN) 0.65 %  SOLN nasal spray Place 1 spray into both nostrils as needed for congestion.  . [DISCONTINUED] traMADol (ULTRAM) 50 MG tablet Take 1 tablet (50 mg total) by mouth every 6 (six) hours as needed (pain). (Patient not taking: Reported on 10/21/2019)   No facility-administered encounter medications on file as of 01/07/2020.    Allergy: No Known Allergies  Social Hx:   Social History   Socioeconomic History  . Marital status: Single    Spouse name: Not on file  . Number of children: Not on file  . Years of education: Not on file  . Highest education level: Not on file  Occupational History  . Not on file  Tobacco Use  . Smoking status: Never Smoker  . Smokeless tobacco: Never Used  Substance and Sexual Activity  . Alcohol use: Not Currently    Comment: occasionally  . Drug use: Never  . Sexual activity: Not Currently  Other Topics Concern  . Not on file  Social History Narrative  . Not on file   Social Determinants of Health   Financial Resource Strain:   . Difficulty of Paying Living Expenses:   Food Insecurity:   . Worried About Charity fundraiser in the Last Year:   .  Ran Out of Food in the Last Year:   Transportation Needs:   . Film/video editor (Medical):   Marland Kitchen Lack of Transportation (Non-Medical):   Physical Activity:   . Days of Exercise per Week:   . Minutes of Exercise per Session:   Stress:   . Feeling of Stress :   Social Connections:   . Frequency of Communication with Friends and Family:   . Frequency of Social Gatherings with Friends and Family:   . Attends Religious Services:   . Active Member of Clubs or Organizations:   . Attends Archivist Meetings:   Marland Kitchen Marital Status:   Intimate Partner Violence:   . Fear of Current or Ex-Partner:   . Emotionally Abused:   Marland Kitchen Physically Abused:   . Sexually Abused:     Past Surgical Hx:  Past Surgical History:  Procedure Laterality Date  . CESAREAN SECTION     X2  . DILATION AND CURETTAGE OF  UTERUS     x 2  . LAPAROSCOPIC GASTRIC SLEEVE RESECTION N/A 06/25/2019   Procedure: LAPAROSCOPIC GASTRIC SLEEVE RESECTION WITH HIATAL HERNIA REPAIR AND UPPER ENDOSCOPY, ERAS Pathway;  Surgeon: Clovis Riley, MD;  Location: [facility];  Service: General;  Laterality: N/A;  . TONSILLECTOMY      Past Medical Hx:  Past Medical History:  Diagnosis Date  . Anxiety   . Arthritis   . Complication of anesthesia    tends to aspirate  . Depression   . Diabetes mellitus without complication ([place])    type 1  dexcom continous glucose meter  . Fibromyalgia   . Headache    history of migraines  . Hypertension   . Hypothyroidism   . PONV (postoperative nausea and vomiting)   . Sleep apnea    cpap    Past Gynecological History:  Menopause at age 51, 2 prior cesarean sections. No LMP recorded. Patient is postmenopausal.  Family Hx:  Family History  Problem Relation Age of Onset  . Cancer Other   . Diabetes Other   . Cataracts Mother   . Ovarian cancer Mother   . Cataracts Father     Review of Systems:  Constitutional  Feels well,    ENT Normal appearing ears and nares bilaterally Skin/Breast  No rash, sores, jaundice, itching, dryness Cardiovascular  No chest pain, shortness of breath, or edema  Pulmonary  No cough or wheeze.  Gastro Intestinal  No nausea, vomitting, or diarrhoea. No bright red blood per rectum, no abdominal pain, change in bowel movement, or constipation.  Genito Urinary  No frequency, urgency, dysuria, + postmenopausal bleeding Musculo Skeletal  No myalgia, arthralgia, joint swelling or pain  Neurologic  No weakness, numbness, change in gait,  Psychology  No depression, anxiety, insomnia.   Vitals:  Blood pressure (!) 123/58, pulse 78, temperature 97.8 F (36.6 C), temperature source Temporal, resp. rate 18, height 5\' 5"  (1.651 m), weight 232 lb 9.6 oz (105.5 kg), SpO2 99 %.  Physical Exam: WD in NAD Neck  Supple NROM, without any enlargements.   Lymph Node Survey No cervical supraclavicular or inguinal adenopathy Cardiovascular  Pulse normal rate, regularity and rhythm. S1 and S2 normal.  Lungs  Clear to auscultation bilateraly, without wheezes/crackles/rhonchi. Good air movement.  Skin  No rash/lesions/breakdown  Psychiatry  Alert and oriented to person, place, and time  Abdomen  Normoactive bowel sounds, abdomen soft, non-tender and obese without evidence of hernia but a diastasis is present in the epigastrium.  Back No CVA tenderness Genito Urinary  Vulva/vagina: Normal external female genitalia.  No lesions. No discharge or bleeding.  Bladder/urethra:  No lesions or masses, well supported bladder  Vagina: grossly normal  Cervix: Normal appearing, no lesions.  Uterus:  Small, mobile, no parametrial involvement or nodularity.  Adnexa: no palpable masses. Rectal  deferred Extremities  No bilateral cyanosis, clubbing or edema.   Thereasa Solo, MD  01/07/2020, 6:45 PM

## 2020-01-08 ENCOUNTER — Telehealth: Payer: Self-pay | Admitting: *Deleted

## 2020-01-08 ENCOUNTER — Encounter (HOSPITAL_COMMUNITY): Payer: Self-pay

## 2020-01-08 LAB — HEMOGLOBIN A1C
Hgb A1c MFr Bld: 10.4 % — ABNORMAL HIGH (ref 4.8–5.6)
Mean Plasma Glucose: 252 mg/dL

## 2020-01-08 NOTE — Telephone Encounter (Signed)
I spoke with patient regarding the results of her lab work. Pt notified of elevated Hgb A1C and creatinine.Pt has an appointment w/ her endocrinologist (Dr. Buddy Duty) tomorrow. Results were faxed to Dr. Cindra Eves office.

## 2020-01-08 NOTE — Patient Instructions (Addendum)
DUE TO COVID-19 ONLY ONE VISITOR IS ALLOWED TO COME WITH YOU AND STAY IN THE WAITING ROOM ONLY DURING PRE OP AND PROCEDURE DAY OF SURGERY. TWO  VISITOR MAY VISIT WITH YOU AFTER SURGERY IN YOUR PRIVATE ROOM DURING VISITING HOURS ONLY!  10-a8p  YOU NEED TO HAVE A COVID 19 TEST ON__4-16-21_____ @_2 :30______, THIS TEST MUST BE DONE BEFORE SURGERY, COME  [address], [place] , [address].  ([place]) ONCE YOUR COVID TEST IS COMPLETED, PLEASE BEGIN THE QUARANTINE INSTRUCTIONS AS OUTLINED IN YOUR HANDOUT.                Christine Mcknight  01/08/2020   Your procedure is scheduled on: 01-14-20   Report to [facility] Main  Entrance   Report to  Short stay  at      0530  AM     Bring cpap mask and tubing only     Call this number if you have problems the morning of surgery [phone]    Remember: Do not eat food or drink liquids :After Midnight.    BRUSH YOUR TEETH MORNING OF SURGERY AND RINSE YOUR MOUTH OUT, NO CHEWING GUM CANDY OR MINTS.     Take these medicines the morning of surgery with A SIP OF WATER: synthroid, prevastatin, cymbalta                                 You may not have any metal on your body including hair pins and              piercings  Do not wear jewelry, make-up, lotions, powders or perfumes, deodorant             Do not wear nail polish on your fingernails.  Do not shave  48 hours prior to surgery.    Do not bring valuables to the hospital. [address].  Contacts, dentures or bridgework may not be worn into surgery.      Patients discharged the day of surgery will not be allowed to drive home. IF YOU ARE HAVING SURGERY AND GOING HOME THE SAME DAY, YOU MUST HAVE AN ADULT TO DRIVE YOU HOME AND BE WITH YOU FOR 24 HOURS. YOU MAY GO HOME BY TAXI OR UBER OR ORTHERWISE, BUT AN ADULT MUST ACCOMPANY YOU HOME AND STAY WITH YOU FOR 24 HOURS.  Name and phone number of your driver:  Special  Instructions: N/A              Please read over the following fact sheets you were given: _____________________________________________________________________ How to Manage Your Diabetes Before and After Surgery  Why is it important to control my blood sugar before and after surgery? . Improving blood sugar levels before and after surgery helps healing and can limit problems. . A way of improving blood sugar control is eating a healthy diet by: o  Eating less sugar and carbohydrates o  Increasing activity/exercise o  Talking with your doctor about reaching your blood sugar goals . High blood sugars (greater than 180 mg/dL) can raise your risk of infections and slow your recovery, so you will need to focus on controlling your diabetes during the weeks before surgery. . Make sure that the doctor who takes care of your diabetes knows about your planned surgery including  the date and location.  How do I manage my blood sugar before surgery? . Check your blood sugar at least 4 times a day, starting 2 days before surgery, to make sure that the level is not too high or low. o Check your blood sugar the morning of your surgery when you wake up and every 2 hours until you get to the Short Stay unit. . If your blood sugar is less than 70 mg/dL, you will need to treat for low blood sugar: o Do not take insulin. o Treat a low blood sugar (less than 70 mg/dL) with  cup of clear juice (cranberry or apple), 4 glucose tablets, OR glucose gel. o Recheck blood sugar in 15 minutes after treatment (to make sure it is greater than 70 mg/dL). If your blood sugar is not greater than 70 mg/dL on recheck, call [phone] for further instructions. . Report your blood sugar to the short stay nurse when you get to Short Stay.  . If you are admitted to the hospital after surgery: o Your blood sugar will be checked by the staff and you will probably be given insulin after surgery (instead of oral diabetes medicines)  to make sure you have good blood sugar levels. o The goal for blood sugar control after surgery is 80-180 mg/dL.   WHAT DO I DO ABOUT MY DIABETES MEDICATION?  Marland Kitchen Do not take oral diabetes medicines (pills) the morning of surgery.  . THE DAY BEFORE SURGERY TAKE TRESBIA  AS USUAL         tHE DAY BEFORE SURGERY TAKE FIASP AS USUAL WITH MEALS NONE AT BEDTIME   . THE MORNING OF SURGERY, TAKE 80% OF TRESBIA DOSE  32 UNITS WHICH IS  (26 UNITS)                                                               ONLY TAKE FIASP DAY OF SURGERY IF BLOOD SUGAR IS GREATER THAN 220 THEN TAKE 1/2 OF                                                                YOUR  USUAL CORRECTION DOSE   . The day of surgery, do not take other diabetes injectables, including Byetta (exenatide), Bydureon (exenatide ER), Victoza (liraglutide), or Trulicity (dulaglutide).  . If your CBG is greater than 220 mg/dL, you may take  of your sliding scale  . (correction) dose of insulin.            [facility] - Preparing for Surgery Before surgery, you can play an important role.  Because skin is not sterile, your skin needs to be as free of germs as possible.  You can reduce the number of germs on your skin by washing with CHG (chlorahexidine gluconate) soap before surgery.  CHG is an antiseptic cleaner which kills germs and bonds with the skin to continue killing germs even after washing. Please DO NOT use if you have an allergy to CHG or antibacterial soaps.  If your skin becomes reddened/irritated stop using  the CHG and inform your nurse when you arrive at Short Stay. Do not shave (including legs and underarms) for at least 48 hours prior to the first CHG shower.  You may shave your face/neck. Please follow these instructions carefully:  1.  Shower with CHG Soap the night before surgery and the  morning of Surgery.  2.  If you choose to wash your hair, wash your hair first as usual with your  normal  shampoo.  3.  After you  shampoo, rinse your hair and body thoroughly to remove the  shampoo.                           4.  Use CHG as you would any other liquid soap.  You can apply chg directly  to the skin and wash                       Gently with a scrungie or clean washcloth.  5.  Apply the CHG Soap to your body ONLY FROM THE NECK DOWN.   Do not use on face/ open                           Wound or open sores. Avoid contact with eyes, ears mouth and genitals (private parts).                       Wash face,  Genitals (private parts) with your normal soap.             6.  Wash thoroughly, paying special attention to the area where your surgery  will be performed.  7.  Thoroughly rinse your body with warm water from the neck down.  8.  DO NOT shower/wash with your normal soap after using and rinsing off  the CHG Soap.                9.  Pat yourself dry with a clean towel.            10.  Wear clean pajamas.            11.  Place clean sheets on your bed the night of your first shower and do not  sleep with pets. Day of Surgery : Do not apply any lotions/deodorants the morning of surgery.  Please wear clean clothes to the hospital/surgery center.  FAILURE TO FOLLOW THESE INSTRUCTIONS MAY RESULT IN THE CANCELLATION OF YOUR SURGERY PATIENT SIGNATURE_________________________________  NURSE SIGNATURE__________________________________  ________________________________________________________________________   Adam Phenix  An incentive spirometer is a tool that can help keep your lungs clear and active. This tool measures how well you are filling your lungs with each breath. Taking long deep breaths may help reverse or decrease the chance of developing breathing (pulmonary) problems (especially infection) following:  A long period of time when you are unable to move or be active. BEFORE THE PROCEDURE   If the spirometer includes an indicator to show your best effort, your nurse or respiratory therapist  will set it to a desired goal.  If possible, sit up straight or lean slightly forward. Try not to slouch.  Hold the incentive spirometer in an upright position. INSTRUCTIONS FOR USE  1. Sit on the edge of your bed if possible, or sit up as far as you can in bed or on a chair. 2. Hold the incentive spirometer  in an upright position. 3. Breathe out normally. 4. Place the mouthpiece in your mouth and seal your lips tightly around it. 5. Breathe in slowly and as deeply as possible, raising the piston or the ball toward the top of the column. 6. Hold your breath for 3-5 seconds or for as long as possible. Allow the piston or ball to fall to the bottom of the column. 7. Remove the mouthpiece from your mouth and breathe out normally. 8. Rest for a few seconds and repeat Steps 1 through 7 at least 10 times every 1-2 hours when you are awake. Take your time and take a few normal breaths between deep breaths. 9. The spirometer may include an indicator to show your best effort. Use the indicator as a goal to work toward during each repetition. 10. After each set of 10 deep breaths, practice coughing to be sure your lungs are clear. If you have an incision (the cut made at the time of surgery), support your incision when coughing by placing a pillow or rolled up towels firmly against it. Once you are able to get out of bed, walk around indoors and cough well. You may stop using the incentive spirometer when instructed by your caregiver.  RISKS AND COMPLICATIONS  Take your time so you do not get dizzy or light-headed.  If you are in pain, you may need to take or ask for pain medication before doing incentive spirometry. It is harder to take a deep breath if you are having pain. AFTER USE  Rest and breathe slowly and easily.  It can be helpful to keep track of a log of your progress. Your caregiver can provide you with a simple table to help with this. If you are using the spirometer at home, follow  these instructions: [place] IF:   You are having difficultly using the spirometer.  You have trouble using the spirometer as often as instructed.  Your pain medication is not giving enough relief while using the spirometer.  You develop fever of 100.5 F (38.1 C) or higher. SEEK IMMEDIATE MEDICAL CARE IF:   You cough up bloody sputum that had not been present before.  You develop fever of 102 F (38.9 C) or greater.  You develop worsening pain at or near the incision site. MAKE SURE YOU:   Understand these instructions.  Will watch your condition.  Will get help right away if you are not doing well or get worse. Document Released: 01/23/2007 Document Revised: 12/05/2011 Document Reviewed: 03/26/2007 ExitCare Patient Information 2014 Exit[employer], [place].   ________________________________________________________________________  WHAT IS A BLOOD TRANSFUSION? Blood Transfusion Information  A transfusion is the replacement of blood or some of its parts. Blood is made up of multiple cells which provide different functions.  Red blood cells carry oxygen and are used for blood loss replacement.  White blood cells fight against infection.  Platelets control bleeding.  Plasma helps clot blood.  Other blood products are available for specialized needs, such as hemophilia or other clotting disorders. BEFORE THE TRANSFUSION  Who gives blood for transfusions?   Healthy volunteers who are fully evaluated to make sure their blood is safe. This is blood bank blood. Transfusion therapy is the safest it has ever been in the practice of medicine. Before blood is taken from a donor, a complete history is taken to make sure that person has no history of diseases nor engages in risky social behavior (examples are intravenous drug use or sexual activity  with multiple partners). The donor's travel history is screened to minimize risk of transmitting infections, such as malaria. The  donated blood is tested for signs of infectious diseases, such as HIV and hepatitis. The blood is then tested to be sure it is compatible with you in order to minimize the chance of a transfusion reaction. If you or a relative donates blood, this is often done in anticipation of surgery and is not appropriate for emergency situations. It takes many days to process the donated blood. RISKS AND COMPLICATIONS Although transfusion therapy is very safe and saves many lives, the main dangers of transfusion include:   Getting an infectious disease.  Developing a transfusion reaction. This is an allergic reaction to something in the blood you were given. Every precaution is taken to prevent this. The decision to have a blood transfusion has been considered carefully by your caregiver before blood is given. Blood is not given unless the benefits outweigh the risks. AFTER THE TRANSFUSION  Right after receiving a blood transfusion, you will usually feel much better and more energetic. This is especially true if your red blood cells have gotten low (anemic). The transfusion raises the level of the red blood cells which carry oxygen, and this usually causes an energy increase.  The nurse administering the transfusion will monitor you carefully for complications. HOME CARE INSTRUCTIONS  No special instructions are needed after a transfusion. You may find your energy is better. Speak with your caregiver about any limitations on activity for underlying diseases you may have. SEEK MEDICAL CARE IF:   Your condition is not improving after your transfusion.  You develop redness or irritation at the intravenous (IV) site. SEEK IMMEDIATE MEDICAL CARE IF:  Any of the following symptoms occur over the next 12 hours:  Shaking chills.  You have a temperature by mouth above 102 F (38.9 C), not controlled by medicine.  Chest, back, or muscle pain.  People around you feel you are not acting correctly or are  confused.  Shortness of breath or difficulty breathing.  Dizziness and fainting.  You get a rash or develop hives.  You have a decrease in urine output.  Your urine turns a dark color or changes to pink, red, or brown. Any of the following symptoms occur over the next 10 days:  You have a temperature by mouth above 102 F (38.9 C), not controlled by medicine.  Shortness of breath.  Weakness after normal activity.  The white part of the eye turns yellow (jaundice).  You have a decrease in the amount of urine or are urinating less often.  Your urine turns a dark color or changes to pink, red, or brown. Document Released: 09/09/2000 Document Revised: 12/05/2011 Document Reviewed: 04/28/2008 [facility] Patient Information 2014 [place], [place].  _______________________________________________________________________

## 2020-01-08 NOTE — Progress Notes (Signed)
PCP - Jonathon Jordan Cardiologist - Endocrinologist- Dr. Buddy Duty  Chest x-ray - 11-24-18 epic EKG - 04-24-19 epic Stress Test -  ECHO -  Cardiac Cath -   Sleep Study -  CPAP - yes  Fasting Blood Sugar - 140 Checks Blood Sugar __2-3___ times a day  Blood Thinner Instructions: Aspirin Instructions: Last Dose:  Anesthesia review: HGBa1c 01-07-20 10.4, type 1 DM  OSA, seeing endocrinologist 4-19  Patient denies shortness of breath, fever, cough and chest pain at PAT appointment   NONE   Patient verbalized understanding of instructions that were given to them at the PAT appointment. Patient was also instructed that they will need to review over the PAT instructions again at home before surgery.

## 2020-01-08 NOTE — Telephone Encounter (Signed)
Routed patient's lab to Dr Buddy Duty her endocrinologist

## 2020-01-09 DIAGNOSIS — Z9884 Bariatric surgery status: Secondary | ICD-10-CM | POA: Diagnosis not present

## 2020-01-09 DIAGNOSIS — E109 Type 1 diabetes mellitus without complications: Secondary | ICD-10-CM | POA: Diagnosis not present

## 2020-01-09 DIAGNOSIS — Z794 Long term (current) use of insulin: Secondary | ICD-10-CM | POA: Diagnosis not present

## 2020-01-09 DIAGNOSIS — E039 Hypothyroidism, unspecified: Secondary | ICD-10-CM | POA: Diagnosis not present

## 2020-01-10 ENCOUNTER — Encounter (HOSPITAL_COMMUNITY)
Admission: RE | Admit: 2020-01-10 | Discharge: 2020-01-10 | Disposition: A | Discharge status: Home / Self Care | Payer: Medicare HMO | Source: Ambulatory Visit | Attending: Gynecologic Oncology | Admitting: Gynecologic Oncology

## 2020-01-10 ENCOUNTER — Other Ambulatory Visit (HOSPITAL_COMMUNITY)
Admission: RE | Admit: 2020-01-10 | Discharge: 2020-01-10 | Disposition: A | Discharge status: Home / Self Care | Payer: Medicare HMO | Source: Ambulatory Visit | Attending: Gynecologic Oncology | Admitting: Gynecologic Oncology

## 2020-01-10 ENCOUNTER — Encounter (HOSPITAL_COMMUNITY): Payer: Self-pay

## 2020-01-10 ENCOUNTER — Other Ambulatory Visit: Payer: Self-pay

## 2020-01-10 ENCOUNTER — Other Ambulatory Visit (HOSPITAL_COMMUNITY): Payer: Medicare HMO

## 2020-01-10 DIAGNOSIS — Z20822 Contact with and (suspected) exposure to covid-19: Secondary | ICD-10-CM | POA: Diagnosis not present

## 2020-01-10 DIAGNOSIS — Z01812 Encounter for preprocedural laboratory examination: Secondary | ICD-10-CM | POA: Diagnosis not present

## 2020-01-10 LAB — CBC
HCT: 36 % (ref 36.0–46.0)
Hemoglobin: 11.8 g/dL — ABNORMAL LOW (ref 12.0–15.0)
MCH: 30.2 pg (ref 26.0–34.0)
MCHC: 32.8 g/dL (ref 30.0–36.0)
MCV: 92.1 fL (ref 80.0–100.0)
Platelets: 301 10*3/uL (ref 150–400)
RBC: 3.91 MIL/uL (ref 3.87–5.11)
RDW: 12.3 % (ref 11.5–15.5)
WBC: 7.4 10*3/uL (ref 4.0–10.5)
nRBC: 0 % (ref 0.0–0.2)

## 2020-01-10 LAB — URINALYSIS, ROUTINE W REFLEX MICROSCOPIC
Bilirubin Urine: NEGATIVE
Glucose, UA: >=500 mg/dL — AB
Hgb urine dipstick: NEGATIVE
Ketones, ur: 5 mg/dL — AB
Nitrite: NEGATIVE
Protein, ur: NEGATIVE mg/dL
Specific Gravity, Urine: 1.023 (ref 1.005–1.030)
pH: 5 (ref 5.0–8.0)

## 2020-01-10 LAB — SARS CORONAVIRUS 2 (TAT 6-24 HRS): SARS Coronavirus 2: NEGATIVE

## 2020-01-13 ENCOUNTER — Telehealth: Payer: Self-pay

## 2020-01-13 ENCOUNTER — Encounter (HOSPITAL_COMMUNITY): Payer: Self-pay

## 2020-01-13 ENCOUNTER — Encounter (HOSPITAL_COMMUNITY): Payer: Self-pay | Admitting: Gynecologic Oncology

## 2020-01-13 ENCOUNTER — Other Ambulatory Visit: Payer: Self-pay

## 2020-01-13 ENCOUNTER — Ambulatory Visit (HOSPITAL_COMMUNITY)
Admission: RE | Admit: 2020-01-13 | Discharge: 2020-01-13 | Disposition: A | Discharge status: Home / Self Care | Payer: Medicare HMO | Source: Ambulatory Visit | Attending: Gynecologic Oncology | Admitting: Gynecologic Oncology

## 2020-01-13 DIAGNOSIS — Z9884 Bariatric surgery status: Secondary | ICD-10-CM | POA: Diagnosis not present

## 2020-01-13 DIAGNOSIS — E039 Hypothyroidism, unspecified: Secondary | ICD-10-CM | POA: Diagnosis not present

## 2020-01-13 DIAGNOSIS — C541 Malignant neoplasm of endometrium: Secondary | ICD-10-CM | POA: Insufficient documentation

## 2020-01-13 DIAGNOSIS — E109 Type 1 diabetes mellitus without complications: Secondary | ICD-10-CM | POA: Diagnosis not present

## 2020-01-13 DIAGNOSIS — Z794 Long term (current) use of insulin: Secondary | ICD-10-CM | POA: Diagnosis not present

## 2020-01-13 HISTORY — DX: Malignant (primary) neoplasm, unspecified: C80.1

## 2020-01-13 MED ORDER — SODIUM CHLORIDE (PF) 0.9 % IJ SOLN
INTRAMUSCULAR | Status: AC
  Filled 2020-01-13: qty 50

## 2020-01-13 MED ORDER — IOHEXOL 300 MG/ML  SOLN
100.0000 mL | Freq: Once | INTRAMUSCULAR | Status: AC | PRN
  Administered 2020-01-13: 100 mL via INTRAVENOUS

## 2020-01-13 NOTE — Telephone Encounter (Signed)
Christine Mcknight states that she understands her pre op instructions. No questions at this time. Reminded her to bring her CPAP mas and tubing incase it is needed.

## 2020-01-13 NOTE — Anesthesia Preprocedure Evaluation (Addendum)
Anesthesia Evaluation  Patient identified by MRN, date of birth, ID band Patient awake    Reviewed: Allergy & Precautions, NPO status , Patient's Chart, lab work & pertinent test results  History of Anesthesia Complications (+) PONV and history of anesthetic complications  Airway Mallampati: III  TM Distance: >3 FB Neck ROM: Full    Dental  (+) Teeth Intact, Chipped, Caps,    Pulmonary sleep apnea and Continuous Positive Airway Pressure Ventilation ,    Pulmonary exam normal breath sounds clear to auscultation       Cardiovascular hypertension, Pt. on medications Normal cardiovascular exam Rhythm:Regular Rate:Normal     Neuro/Psych  Headaches, PSYCHIATRIC DISORDERS Anxiety Depression  Neuromuscular disease    GI/Hepatic Neg liver ROS, GERD  Medicated and Controlled,  Endo/Other  diabetes, Poorly Controlled, Type 2, Oral Hypoglycemic AgentsHypothyroidism Hyperlipidemia Obesity  Renal/GU negative Renal ROS  negative genitourinary   Musculoskeletal  (+) Arthritis , Osteoarthritis,  Fibromyalgia -, narcotic dependentObesity HLD   Abdominal (+) + obese,   Peds  Hematology   Anesthesia Other Findings   Reproductive/Obstetrics Endometrial Ca                          Anesthesia Physical Anesthesia Plan  ASA: III  Anesthesia Plan: General   Post-op Pain Management:    Induction: Intravenous  PONV Risk Score and Plan: 4 or greater and Diphenhydramine, Propofol infusion, Ondansetron, Dexamethasone and Treatment may vary due to age or medical condition  Airway Management Planned: Oral ETT  Additional Equipment:   Intra-op Plan:   Post-operative Plan: Extubation in OR  Informed Consent: I have reviewed the patients History and Physical, chart, labs and discussed the procedure including the risks, benefits and alternatives for the proposed anesthesia with the patient or authorized  representative who has indicated his/her understanding and acceptance.     Dental advisory given  Plan Discussed with: CRNA and Surgeon  Anesthesia Plan Comments: (2 PIV's one on each side)       Anesthesia Quick Evaluation

## 2020-01-14 ENCOUNTER — Ambulatory Visit (HOSPITAL_COMMUNITY): Payer: Medicare HMO | Admitting: Anesthesiology

## 2020-01-14 ENCOUNTER — Ambulatory Visit (HOSPITAL_COMMUNITY)
Admission: RE | Admit: 2020-01-14 | Discharge: 2020-01-15 | Disposition: A | Discharge status: Home / Self Care | Payer: Medicare HMO | Attending: Gynecologic Oncology | Admitting: Gynecologic Oncology

## 2020-01-14 ENCOUNTER — Encounter (HOSPITAL_COMMUNITY)
Admission: RE | Disposition: A | Discharge status: Home / Self Care | Payer: Self-pay | Source: Home / Self Care | Attending: Gynecologic Oncology

## 2020-01-14 ENCOUNTER — Ambulatory Visit (HOSPITAL_COMMUNITY): Payer: Medicare HMO | Admitting: Physician Assistant

## 2020-01-14 ENCOUNTER — Encounter (HOSPITAL_COMMUNITY): Payer: Self-pay | Admitting: Gynecologic Oncology

## 2020-01-14 DIAGNOSIS — N800 Endometriosis of uterus: Secondary | ICD-10-CM | POA: Diagnosis not present

## 2020-01-14 DIAGNOSIS — E10319 Type 1 diabetes mellitus with unspecified diabetic retinopathy without macular edema: Secondary | ICD-10-CM | POA: Insufficient documentation

## 2020-01-14 DIAGNOSIS — E109 Type 1 diabetes mellitus without complications: Secondary | ICD-10-CM | POA: Insufficient documentation

## 2020-01-14 DIAGNOSIS — M1990 Unspecified osteoarthritis, unspecified site: Secondary | ICD-10-CM | POA: Diagnosis not present

## 2020-01-14 DIAGNOSIS — F329 Major depressive disorder, single episode, unspecified: Secondary | ICD-10-CM | POA: Insufficient documentation

## 2020-01-14 DIAGNOSIS — Z794 Long term (current) use of insulin: Secondary | ICD-10-CM | POA: Diagnosis not present

## 2020-01-14 DIAGNOSIS — M797 Fibromyalgia: Secondary | ICD-10-CM | POA: Insufficient documentation

## 2020-01-14 DIAGNOSIS — E785 Hyperlipidemia, unspecified: Secondary | ICD-10-CM | POA: Diagnosis not present

## 2020-01-14 DIAGNOSIS — E1065 Type 1 diabetes mellitus with hyperglycemia: Secondary | ICD-10-CM

## 2020-01-14 DIAGNOSIS — Z79899 Other long term (current) drug therapy: Secondary | ICD-10-CM | POA: Diagnosis not present

## 2020-01-14 DIAGNOSIS — E039 Hypothyroidism, unspecified: Secondary | ICD-10-CM | POA: Diagnosis not present

## 2020-01-14 DIAGNOSIS — Z79890 Hormone replacement therapy (postmenopausal): Secondary | ICD-10-CM | POA: Insufficient documentation

## 2020-01-14 DIAGNOSIS — Z9884 Bariatric surgery status: Secondary | ICD-10-CM | POA: Diagnosis not present

## 2020-01-14 DIAGNOSIS — F419 Anxiety disorder, unspecified: Secondary | ICD-10-CM | POA: Diagnosis not present

## 2020-01-14 DIAGNOSIS — Z6839 Body mass index (BMI) 39.0-39.9, adult: Secondary | ICD-10-CM | POA: Diagnosis not present

## 2020-01-14 DIAGNOSIS — C541 Malignant neoplasm of endometrium: Secondary | ICD-10-CM | POA: Diagnosis present

## 2020-01-14 DIAGNOSIS — I10 Essential (primary) hypertension: Secondary | ICD-10-CM | POA: Diagnosis not present

## 2020-01-14 DIAGNOSIS — IMO0002 Reserved for concepts with insufficient information to code with codable children: Secondary | ICD-10-CM | POA: Diagnosis present

## 2020-01-14 DIAGNOSIS — R69 Illness, unspecified: Secondary | ICD-10-CM | POA: Diagnosis not present

## 2020-01-14 HISTORY — PX: SENTINEL NODE BIOPSY: SHX6608

## 2020-01-14 HISTORY — PX: ROBOTIC ASSISTED TOTAL HYSTERECTOMY WITH BILATERAL SALPINGO OOPHERECTOMY: SHX6086

## 2020-01-14 LAB — URINALYSIS, ROUTINE W REFLEX MICROSCOPIC
Bacteria, UA: NONE SEEN
Bilirubin Urine: NEGATIVE
Glucose, UA: >=500 mg/dL — AB
Ketones, ur: NEGATIVE mg/dL
Leukocytes,Ua: NEGATIVE
Nitrite: NEGATIVE
Protein, ur: NEGATIVE mg/dL
Specific Gravity, Urine: 1.024 (ref 1.005–1.030)
pH: 5 (ref 5.0–8.0)

## 2020-01-14 LAB — BASIC METABOLIC PANEL
Anion gap: 10 (ref 5–15)
BUN: 16 mg/dL (ref 8–23)
CO2: 26 mmol/L (ref 22–32)
Calcium: 8.5 mg/dL — ABNORMAL LOW (ref 8.9–10.3)
Chloride: 97 mmol/L — ABNORMAL LOW (ref 98–111)
Creatinine, Ser: 0.92 mg/dL (ref 0.44–1.00)
GFR calc Af Amer: >60 mL/min (ref >60)
GFR calc non Af Amer: >60 mL/min (ref >60)
Glucose, Bld: 452 mg/dL — ABNORMAL HIGH (ref 70–99)
Potassium: 5.7 mmol/L — ABNORMAL HIGH (ref 3.5–5.1)
Sodium: 133 mmol/L — ABNORMAL LOW (ref 135–145)

## 2020-01-14 LAB — GLUCOSE, CAPILLARY
Glucose-Capillary: 124 mg/dL — ABNORMAL HIGH (ref 70–99)
Glucose-Capillary: 126 mg/dL — ABNORMAL HIGH (ref 70–99)
Glucose-Capillary: 287 mg/dL — ABNORMAL HIGH (ref 70–99)
Glucose-Capillary: 295 mg/dL — ABNORMAL HIGH (ref 70–99)
Glucose-Capillary: 328 mg/dL — ABNORMAL HIGH (ref 70–99)
Glucose-Capillary: 414 mg/dL — ABNORMAL HIGH (ref 70–99)
Glucose-Capillary: 436 mg/dL — ABNORMAL HIGH (ref 70–99)
Glucose-Capillary: 91 mg/dL (ref 70–99)

## 2020-01-14 LAB — TYPE AND SCREEN
ABO/RH(D): A POS
Antibody Screen: NEGATIVE

## 2020-01-14 SURGERY — HYSTERECTOMY, TOTAL, ROBOT-ASSISTED, LAPAROSCOPIC, WITH BILATERAL SALPINGO-OOPHORECTOMY
Anesthesia: General

## 2020-01-14 MED ORDER — ACETAMINOPHEN 500 MG PO TABS
1000.0000 mg | ORAL_TABLET | Freq: Two times a day (BID) | ORAL | Status: DC
  Administered 2020-01-14 – 2020-01-15 (×2): 1000 mg via ORAL
  Filled 2020-01-14 (×2): qty 2

## 2020-01-14 MED ORDER — KETAMINE HCL 10 MG/ML IJ SOLN
INTRAMUSCULAR | Status: DC | PRN
  Administered 2020-01-14: 20 mg via INTRAVENOUS

## 2020-01-14 MED ORDER — SUGAMMADEX SODIUM 500 MG/5ML IV SOLN
INTRAVENOUS | Status: DC | PRN
  Administered 2020-01-14: 300 mg via INTRAVENOUS

## 2020-01-14 MED ORDER — FENTANYL CITRATE (PF) 100 MCG/2ML IJ SOLN
INTRAMUSCULAR | Status: AC
  Filled 2020-01-14: qty 2

## 2020-01-14 MED ORDER — PROPOFOL 10 MG/ML IV BOLUS
INTRAVENOUS | Status: AC
  Filled 2020-01-14: qty 20

## 2020-01-14 MED ORDER — MIDAZOLAM HCL 5 MG/5ML IJ SOLN
INTRAMUSCULAR | Status: DC | PRN
  Administered 2020-01-14: 2 mg via INTRAVENOUS

## 2020-01-14 MED ORDER — SUCCINYLCHOLINE CHLORIDE 200 MG/10ML IV SOSY
PREFILLED_SYRINGE | INTRAVENOUS | Status: DC | PRN
  Administered 2020-01-14: 120 mg via INTRAVENOUS

## 2020-01-14 MED ORDER — CEFAZOLIN SODIUM-DEXTROSE 2-4 GM/100ML-% IV SOLN
2.0000 g | INTRAVENOUS | Status: AC
  Administered 2020-01-14: 2 g via INTRAVENOUS

## 2020-01-14 MED ORDER — LIDOCAINE 2% (20 MG/ML) 5 ML SYRINGE
INTRAMUSCULAR | Status: DC | PRN
  Administered 2020-01-14: 1.5 mg/kg/h via INTRAVENOUS
  Administered 2020-01-14: 80 mg via INTRAVENOUS

## 2020-01-14 MED ORDER — BUPIVACAINE HCL 0.25 % IJ SOLN
INTRAMUSCULAR | Status: AC
  Filled 2020-01-14: qty 1

## 2020-01-14 MED ORDER — STERILE WATER FOR INJECTION IJ SOLN
INTRAMUSCULAR | Status: DC | PRN
  Administered 2020-01-14: 4 mL

## 2020-01-14 MED ORDER — ONDANSETRON HCL 4 MG/2ML IJ SOLN
4.0000 mg | Freq: Four times a day (QID) | INTRAMUSCULAR | Status: DC | PRN
  Administered 2020-01-14: 4 mg via INTRAVENOUS
  Filled 2020-01-14: qty 2

## 2020-01-14 MED ORDER — INSULIN ASPART 100 UNIT/ML ~~LOC~~ SOLN: 6.0000 [IU] | Freq: Three times a day (TID) | SUBCUTANEOUS | Status: DC

## 2020-01-14 MED ORDER — LIDOCAINE 2% (20 MG/ML) 5 ML SYRINGE
INTRAMUSCULAR | Status: AC
  Filled 2020-01-14: qty 5

## 2020-01-14 MED ORDER — INSULIN GLARGINE 100 UNIT/ML ~~LOC~~ SOLN
36.0000 [IU] | Freq: Every day | SUBCUTANEOUS | Status: DC
  Filled 2020-01-14: qty 0.36

## 2020-01-14 MED ORDER — DEXAMETHASONE SODIUM PHOSPHATE 10 MG/ML IJ SOLN
INTRAMUSCULAR | Status: AC
  Filled 2020-01-14: qty 1

## 2020-01-14 MED ORDER — OXYCODONE HCL 5 MG PO TABS: 5.0000 mg | ORAL_TABLET | Freq: Once | ORAL | Status: DC | PRN

## 2020-01-14 MED ORDER — OXYCODONE HCL 5 MG/5ML PO SOLN: 5.0000 mg | Freq: Once | ORAL | Status: DC | PRN

## 2020-01-14 MED ORDER — OXYCODONE HCL 5 MG PO TABS: 5.0000 mg | ORAL_TABLET | ORAL | Status: DC | PRN

## 2020-01-14 MED ORDER — SODIUM CHLORIDE 0.9 % IV SOLN: INTRAVENOUS | Status: DC

## 2020-01-14 MED ORDER — STERILE WATER FOR IRRIGATION IR SOLN
Status: DC | PRN
  Administered 2020-01-14: 1000 mL

## 2020-01-14 MED ORDER — FENTANYL CITRATE (PF) 100 MCG/2ML IJ SOLN
INTRAMUSCULAR | Status: DC | PRN
  Administered 2020-01-14: 50 ug via INTRAVENOUS

## 2020-01-14 MED ORDER — LACTATED RINGERS IR SOLN
Status: DC | PRN
  Administered 2020-01-14: 1

## 2020-01-14 MED ORDER — PHENYLEPHRINE HCL-NACL 20-0.9 MG/250ML-% IV SOLN
INTRAVENOUS | Status: DC | PRN
  Administered 2020-01-14: 40 ug/min via INTRAVENOUS

## 2020-01-14 MED ORDER — ONDANSETRON HCL 4 MG/2ML IJ SOLN
INTRAMUSCULAR | Status: AC
  Filled 2020-01-14: qty 2

## 2020-01-14 MED ORDER — MEPERIDINE HCL 50 MG/ML IJ SOLN: 6.2500 mg | INTRAMUSCULAR | Status: DC | PRN

## 2020-01-14 MED ORDER — LEVOTHYROXINE SODIUM 75 MCG PO TABS
150.0000 ug | ORAL_TABLET | Freq: Every day | ORAL | Status: DC
  Administered 2020-01-15: 150 ug via ORAL
  Filled 2020-01-14: qty 2

## 2020-01-14 MED ORDER — FENTANYL CITRATE (PF) 100 MCG/2ML IJ SOLN
25.0000 ug | INTRAMUSCULAR | Status: DC | PRN
  Administered 2020-01-14 (×2): 50 ug via INTRAVENOUS

## 2020-01-14 MED ORDER — PHENYLEPHRINE 40 MCG/ML (10ML) SYRINGE FOR IV PUSH (FOR BLOOD PRESSURE SUPPORT)
PREFILLED_SYRINGE | INTRAVENOUS | Status: DC | PRN
  Administered 2020-01-14 (×2): 80 ug via INTRAVENOUS

## 2020-01-14 MED ORDER — ONDANSETRON HCL 4 MG PO TABS: 4.0000 mg | ORAL_TABLET | Freq: Four times a day (QID) | ORAL | Status: DC | PRN

## 2020-01-14 MED ORDER — EPHEDRINE 5 MG/ML INJ
INTRAVENOUS | Status: AC
  Filled 2020-01-14: qty 10

## 2020-01-14 MED ORDER — INSULIN ASPART 100 UNIT/ML ~~LOC~~ SOLN: 0.0000 [IU] | SUBCUTANEOUS | Status: DC

## 2020-01-14 MED ORDER — INSULIN ASPART 100 UNIT/ML ~~LOC~~ SOLN: 0.0000 [IU] | Freq: Every day | SUBCUTANEOUS | Status: DC

## 2020-01-14 MED ORDER — KCL IN DEXTROSE-NACL 20-5-0.45 MEQ/L-%-% IV SOLN
INTRAVENOUS | Status: DC
  Filled 2020-01-14: qty 1000

## 2020-01-14 MED ORDER — ACETAMINOPHEN 500 MG PO TABS: 1000.0000 mg | ORAL_TABLET | ORAL | Status: AC

## 2020-01-14 MED ORDER — ENOXAPARIN SODIUM 40 MG/0.4ML ~~LOC~~ SOLN
SUBCUTANEOUS | Status: AC
  Administered 2020-01-14: 40 mg via SUBCUTANEOUS
  Filled 2020-01-14: qty 0.4

## 2020-01-14 MED ORDER — KETAMINE HCL 10 MG/ML IJ SOLN
INTRAMUSCULAR | Status: AC
  Filled 2020-01-14: qty 1

## 2020-01-14 MED ORDER — SODIUM CHLORIDE 0.9 % IV BOLUS
250.0000 mL | Freq: Once | INTRAVENOUS | Status: AC
  Administered 2020-01-14: 250 mL via INTRAVENOUS

## 2020-01-14 MED ORDER — HYDROMORPHONE HCL 1 MG/ML IJ SOLN
0.5000 mg | INTRAMUSCULAR | Status: DC | PRN
  Administered 2020-01-14: 0.5 mg via INTRAVENOUS
  Filled 2020-01-14: qty 0.5

## 2020-01-14 MED ORDER — INSULIN ASPART 100 UNIT/ML ~~LOC~~ SOLN
6.0000 [IU] | Freq: Three times a day (TID) | SUBCUTANEOUS | Status: DC
  Administered 2020-01-14: 6 [IU] via SUBCUTANEOUS

## 2020-01-14 MED ORDER — ONDANSETRON HCL 4 MG/2ML IJ SOLN
INTRAMUSCULAR | Status: DC | PRN
  Administered 2020-01-14: 4 mg via INTRAVENOUS

## 2020-01-14 MED ORDER — SENNOSIDES-DOCUSATE SODIUM 8.6-50 MG PO TABS
2.0000 | ORAL_TABLET | Freq: Every day | ORAL | Status: DC
  Administered 2020-01-14: 2 via ORAL
  Filled 2020-01-14: qty 2

## 2020-01-14 MED ORDER — SUCCINYLCHOLINE CHLORIDE 200 MG/10ML IV SOSY
PREFILLED_SYRINGE | INTRAVENOUS | Status: AC
  Filled 2020-01-14: qty 10

## 2020-01-14 MED ORDER — FENTANYL CITRATE (PF) 250 MCG/5ML IJ SOLN
INTRAMUSCULAR | Status: AC
  Filled 2020-01-14: qty 5

## 2020-01-14 MED ORDER — PROPOFOL 10 MG/ML IV BOLUS
INTRAVENOUS | Status: DC | PRN
  Administered 2020-01-14: 25 ug/kg/min via INTRAVENOUS
  Administered 2020-01-14: 150 mg via INTRAVENOUS

## 2020-01-14 MED ORDER — STERILE WATER FOR INJECTION IJ SOLN
INTRAMUSCULAR | Status: AC
  Filled 2020-01-14: qty 10

## 2020-01-14 MED ORDER — BUPIVACAINE HCL 0.25 % IJ SOLN
INTRAMUSCULAR | Status: DC | PRN
  Administered 2020-01-14: 20 mL

## 2020-01-14 MED ORDER — ENOXAPARIN SODIUM 40 MG/0.4ML ~~LOC~~ SOLN: 40.0000 mg | SUBCUTANEOUS | Status: AC

## 2020-01-14 MED ORDER — DIPHENHYDRAMINE HCL 50 MG/ML IJ SOLN
INTRAMUSCULAR | Status: AC
  Filled 2020-01-14: qty 1

## 2020-01-14 MED ORDER — ROCURONIUM BROMIDE 10 MG/ML (PF) SYRINGE
PREFILLED_SYRINGE | INTRAVENOUS | Status: AC
  Filled 2020-01-14: qty 10

## 2020-01-14 MED ORDER — PRAVASTATIN SODIUM 20 MG PO TABS
20.0000 mg | ORAL_TABLET | Freq: Every day | ORAL | Status: DC
  Administered 2020-01-14 – 2020-01-15 (×2): 20 mg via ORAL
  Filled 2020-01-14 (×2): qty 1

## 2020-01-14 MED ORDER — GABAPENTIN 100 MG PO CAPS: 200.0000 mg | ORAL_CAPSULE | ORAL | Status: AC

## 2020-01-14 MED ORDER — ENOXAPARIN SODIUM 40 MG/0.4ML ~~LOC~~ SOLN
40.0000 mg | SUBCUTANEOUS | Status: DC
  Administered 2020-01-15: 40 mg via SUBCUTANEOUS
  Filled 2020-01-14: qty 0.4

## 2020-01-14 MED ORDER — ONDANSETRON HCL 4 MG/2ML IJ SOLN
4.0000 mg | Freq: Once | INTRAMUSCULAR | Status: AC | PRN
  Administered 2020-01-14: 10:00:00 4 mg via INTRAVENOUS

## 2020-01-14 MED ORDER — DEXAMETHASONE SODIUM PHOSPHATE 4 MG/ML IJ SOLN: 4.0000 mg | INTRAMUSCULAR | Status: DC

## 2020-01-14 MED ORDER — DULOXETINE HCL 60 MG PO CPEP
60.0000 mg | ORAL_CAPSULE | Freq: Every day | ORAL | Status: DC
  Administered 2020-01-14 – 2020-01-15 (×2): 60 mg via ORAL
  Filled 2020-01-14 (×2): qty 1

## 2020-01-14 MED ORDER — EPHEDRINE SULFATE-NACL 50-0.9 MG/10ML-% IV SOSY
PREFILLED_SYRINGE | INTRAVENOUS | Status: DC | PRN
  Administered 2020-01-14 (×3): 10 mg via INTRAVENOUS

## 2020-01-14 MED ORDER — GABAPENTIN 100 MG PO CAPS
ORAL_CAPSULE | ORAL | Status: AC
  Administered 2020-01-14: 200 mg via ORAL
  Filled 2020-01-14: qty 2

## 2020-01-14 MED ORDER — PANTOPRAZOLE SODIUM 40 MG PO TBEC
40.0000 mg | DELAYED_RELEASE_TABLET | Freq: Every day | ORAL | Status: DC
  Administered 2020-01-14 – 2020-01-15 (×2): 40 mg via ORAL
  Filled 2020-01-14 (×2): qty 1

## 2020-01-14 MED ORDER — LIDOCAINE HCL 2 % IJ SOLN
INTRAMUSCULAR | Status: AC
  Filled 2020-01-14: qty 20

## 2020-01-14 MED ORDER — PHENYLEPHRINE 40 MCG/ML (10ML) SYRINGE FOR IV PUSH (FOR BLOOD PRESSURE SUPPORT)
PREFILLED_SYRINGE | INTRAVENOUS | Status: AC
  Filled 2020-01-14: qty 10

## 2020-01-14 MED ORDER — CEFAZOLIN SODIUM-DEXTROSE 2-4 GM/100ML-% IV SOLN
INTRAVENOUS | Status: AC
  Filled 2020-01-14: qty 100

## 2020-01-14 MED ORDER — MIDAZOLAM HCL 2 MG/2ML IJ SOLN
INTRAMUSCULAR | Status: AC
  Filled 2020-01-14: qty 2

## 2020-01-14 MED ORDER — DEXAMETHASONE SODIUM PHOSPHATE 10 MG/ML IJ SOLN
INTRAMUSCULAR | Status: DC | PRN
  Administered 2020-01-14: 8 mg via INTRAVENOUS

## 2020-01-14 MED ORDER — DIPHENHYDRAMINE HCL 50 MG/ML IJ SOLN
INTRAMUSCULAR | Status: DC | PRN
  Administered 2020-01-14: 6.25 mg via INTRAVENOUS

## 2020-01-14 MED ORDER — LACTATED RINGERS IV SOLN: INTRAVENOUS | Status: DC

## 2020-01-14 MED ORDER — ROCURONIUM BROMIDE 10 MG/ML (PF) SYRINGE
PREFILLED_SYRINGE | INTRAVENOUS | Status: DC | PRN
  Administered 2020-01-14: 60 mg via INTRAVENOUS
  Administered 2020-01-14: 20 mg via INTRAVENOUS
  Administered 2020-01-14: 5 mg via INTRAVENOUS

## 2020-01-14 MED ORDER — ACETAMINOPHEN 500 MG PO TABS
ORAL_TABLET | ORAL | Status: AC
  Administered 2020-01-14: 06:00:00 1000 mg via ORAL
  Filled 2020-01-14: qty 2

## 2020-01-14 MED ORDER — IBUPROFEN 400 MG PO TABS: 600.0000 mg | ORAL_TABLET | Freq: Four times a day (QID) | ORAL | Status: DC | PRN

## 2020-01-14 MED ORDER — GABAPENTIN 100 MG PO CAPS
100.0000 mg | ORAL_CAPSULE | Freq: Every day | ORAL | Status: DC
  Administered 2020-01-14: 100 mg via ORAL
  Filled 2020-01-14: qty 1

## 2020-01-14 MED ORDER — INSULIN ASPART 100 UNIT/ML ~~LOC~~ SOLN: 0.0000 [IU] | Freq: Three times a day (TID) | SUBCUTANEOUS | Status: DC

## 2020-01-14 MED ORDER — PHENYLEPHRINE HCL (PRESSORS) 10 MG/ML IV SOLN
INTRAVENOUS | Status: AC
  Filled 2020-01-14: qty 2

## 2020-01-14 MED ORDER — TRAMADOL HCL 50 MG PO TABS
100.0000 mg | ORAL_TABLET | Freq: Two times a day (BID) | ORAL | Status: DC | PRN
  Administered 2020-01-14: 13:00:00 100 mg via ORAL
  Filled 2020-01-14: qty 2

## 2020-01-14 SURGICAL SUPPLY — 64 items
APPLICATOR SURGIFLO ENDO (HEMOSTASIS) IMPLANT
BACTOSHIELD CHG 4% 4OZ (MISCELLANEOUS) ×1
BAG LAPAROSCOPIC 12 15 PORT 16 (BASKET) IMPLANT
BAG RETRIEVAL 12/15 (BASKET)
BLADE SURG SZ10 CARB STEEL (BLADE) IMPLANT
COVER BACK TABLE 60X90IN (DRAPES) ×3 IMPLANT
COVER TIP SHEARS 8 DVNC (MISCELLANEOUS) ×2 IMPLANT
COVER TIP SHEARS 8MM DA VINCI (MISCELLANEOUS) ×1
COVER WAND RF STERILE (DRAPES) IMPLANT
DECANTER SPIKE VIAL GLASS SM (MISCELLANEOUS) ×6 IMPLANT
DERMABOND ADVANCED (GAUZE/BANDAGES/DRESSINGS) ×1
DERMABOND ADVANCED .7 DNX12 (GAUZE/BANDAGES/DRESSINGS) ×2 IMPLANT
DRAPE ARM DVNC X/XI (DISPOSABLE) ×8 IMPLANT
DRAPE COLUMN DVNC XI (DISPOSABLE) ×2 IMPLANT
DRAPE DA VINCI XI ARM (DISPOSABLE) ×4
DRAPE DA VINCI XI COLUMN (DISPOSABLE) ×1
DRAPE SHEET LG 3/4 BI-LAMINATE (DRAPES) ×3 IMPLANT
DRAPE SURG IRRIG POUCH 19X23 (DRAPES) ×3 IMPLANT
DRSG OPSITE POSTOP 4X6 (GAUZE/BANDAGES/DRESSINGS) IMPLANT
DRSG OPSITE POSTOP 4X8 (GAUZE/BANDAGES/DRESSINGS) IMPLANT
ELECT REM PT RETURN 15FT ADLT (MISCELLANEOUS) ×3 IMPLANT
GLOVE BIO SURGEON STRL SZ 6 (GLOVE) ×12 IMPLANT
GLOVE BIO SURGEON STRL SZ 6.5 (GLOVE) IMPLANT
GOWN STRL REUS W/ TWL LRG LVL3 (GOWN DISPOSABLE) ×8 IMPLANT
GOWN STRL REUS W/TWL LRG LVL3 (GOWN DISPOSABLE) ×4
HOLDER FOLEY CATH W/STRAP (MISCELLANEOUS) ×3 IMPLANT
IRRIG SUCT STRYKERFLOW 2 WTIP (MISCELLANEOUS) ×3
IRRIGATION SUCT STRKRFLW 2 WTP (MISCELLANEOUS) ×2 IMPLANT
KIT PROCEDURE DA VINCI SI (MISCELLANEOUS) ×1
KIT PROCEDURE DVNC SI (MISCELLANEOUS) ×2 IMPLANT
KIT TURNOVER KIT A (KITS) ×3 IMPLANT
MANIPULATOR UTERINE 4.5 ZUMI (MISCELLANEOUS) ×3 IMPLANT
NEEDLE HYPO 22GX1.5 SAFETY (NEEDLE) IMPLANT
NEEDLE SPNL 18GX3.5 QUINCKE PK (NEEDLE) IMPLANT
OBTURATOR OPTICAL STANDARD 8MM (TROCAR) ×1
OBTURATOR OPTICAL STND 8 DVNC (TROCAR) ×2
OBTURATOR OPTICALSTD 8 DVNC (TROCAR) ×2 IMPLANT
PACK ROBOT GYN CUSTOM WL (TRAY / TRAY PROCEDURE) ×3 IMPLANT
PAD POSITIONING PINK XL (MISCELLANEOUS) ×3 IMPLANT
PENCIL SMOKE EVACUATOR (MISCELLANEOUS) IMPLANT
PORT ACCESS TROCAR AIRSEAL 12 (TROCAR) ×2 IMPLANT
PORT ACCESS TROCAR AIRSEAL 5M (TROCAR) ×1
POUCH SPECIMEN RETRIEVAL 10MM (ENDOMECHANICALS) IMPLANT
SCRUB CHG 4% DYNA-HEX 4OZ (MISCELLANEOUS) ×2 IMPLANT
SEAL CANN UNIV 5-8 DVNC XI (MISCELLANEOUS) ×6 IMPLANT
SEAL XI 5MM-8MM UNIVERSAL (MISCELLANEOUS) ×3
SET TRI-LUMEN FLTR TB AIRSEAL (TUBING) ×3 IMPLANT
SPONGE LAP 18X18 RF (DISPOSABLE) IMPLANT
SURGIFLO W/THROMBIN 8M KIT (HEMOSTASIS) IMPLANT
SUT MNCRL AB 4-0 PS2 18 (SUTURE) IMPLANT
SUT PDS AB 1 TP1 96 (SUTURE) IMPLANT
SUT VIC AB 0 CT1 27 (SUTURE)
SUT VIC AB 0 CT1 27XBRD ANTBC (SUTURE) IMPLANT
SUT VIC AB 2-0 CT1 27 (SUTURE)
SUT VIC AB 2-0 CT1 TAPERPNT 27 (SUTURE) IMPLANT
SUT VICRYL 4-0 PS2 18IN ABS (SUTURE) ×6 IMPLANT
SYR 10ML LL (SYRINGE) IMPLANT
TOWEL OR NON WOVEN STRL DISP B (DISPOSABLE) ×3 IMPLANT
TRAP SPECIMEN MUCUS 40CC (MISCELLANEOUS) ×3 IMPLANT
TRAY FOLEY MTR SLVR 16FR STAT (SET/KITS/TRAYS/PACK) ×3 IMPLANT
TROCAR XCEL NON-BLD 5MMX100MML (ENDOMECHANICALS) IMPLANT
UNDERPAD 30X36 HEAVY ABSORB (UNDERPADS AND DIAPERS) ×3 IMPLANT
WATER STERILE IRR 1000ML POUR (IV SOLUTION) ×3 IMPLANT
YANKAUER SUCT BULB TIP 10FT TU (MISCELLANEOUS) IMPLANT

## 2020-01-14 NOTE — Progress Notes (Signed)
Patient CBG is 436. Per sliding scale MD called and order to verify is obtain. Patient while calling MD checked her sugar with home device and it read 372 and administered herself home insulin without RN approval to do so. MD made aware.

## 2020-01-14 NOTE — Transfer of Care (Signed)
Immediate Anesthesia Transfer of Care Note  Patient: Nehemie Noreen  Procedure(s) Performed: XI ROBOTIC ASSISTED TOTAL HYSTERECTOMY WITH BILATERAL SALPINGO OOPHORECTOMY (Bilateral ) SENTINEL NODE BIOPSY; OMENTUM BIOPSY (N/A )  Patient Location: PACU  Anesthesia Type:General  Level of Consciousness: awake, alert , oriented and patient cooperative  Airway & Oxygen Therapy: Patient Spontanous Breathing and Patient connected to face mask oxygen  Post-op Assessment: Report given to RN, Post -op Vital signs reviewed and stable and Patient moving all extremities  Post vital signs: Reviewed and stable  Last Vitals:  Vitals Value Taken Time  BP 145/68 01/14/20 0941  Temp    Pulse 59 01/14/20 0943  Resp 16 01/14/20 0943  SpO2 100 % 01/14/20 0943  Vitals shown include unvalidated device data.  Last Pain:  Vitals:   01/14/20 0543  TempSrc: Oral         Complications: No apparent anesthesia complications

## 2020-01-14 NOTE — Interval H&P Note (Signed)
History and Physical Interval Note:  01/14/2020 7:18 AM  Christine Mcknight  has presented today for surgery, with the diagnosis of ENDOMETRIAL CANCER.  The various methods of treatment have been discussed with the patient and family. After consideration of risks, benefits and other options for treatment, the patient has consented to  Procedure(s): XI ROBOTIC ASSISTED TOTAL HYSTERECTOMY WITH BILATERAL SALPINGO OOPHORECTOMY (Bilateral) SENTINEL NODE BIOPSY (N/A) as a surgical intervention.  The patient's history has been reviewed, patient examined, no change in status, stable for surgery.  I have reviewed the patient's chart and labs.  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 I discussed the implications of this with the patient and that we will attempt to resect all resectable disease. Questions were answered to the patient's satisfaction.     Thereasa Solo

## 2020-01-14 NOTE — Anesthesia Postprocedure Evaluation (Signed)
Anesthesia Post Note  Patient: Christine Mcknight  Procedure(s) Performed: XI ROBOTIC ASSISTED TOTAL HYSTERECTOMY WITH BILATERAL SALPINGO OOPHORECTOMY (Bilateral ) SENTINEL NODE BIOPSY; OMENTUM BIOPSY (N/A )     Patient location during evaluation: PACU Anesthesia Type: General Level of consciousness: awake and alert and oriented Pain management: pain level controlled Vital Signs Assessment: post-procedure vital signs reviewed and stable Respiratory status: spontaneous breathing, nonlabored ventilation and respiratory function stable Cardiovascular status: blood pressure returned to baseline and stable Postop Assessment: no apparent nausea or vomiting Anesthetic complications: no    Last Vitals:  Vitals:   01/14/20 1030 01/14/20 1045  BP: 137/66 139/68  Pulse: (!) 58 60  Resp: 13 12  Temp:    SpO2: 97% 95%    Last Pain:  Vitals:   01/14/20 1045  TempSrc:   PainSc: Asleep                 Kelvin Sennett A.

## 2020-01-14 NOTE — Op Note (Signed)
OPERATIVE NOTE 01/14/20  Surgeon: Donaciano Eva   Assistants: Dr Lahoma Crocker (an MD assistant was necessary for tissue manipulation, management of robotic instrumentation, retraction and positioning due to the complexity of the case and hospital policies).   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   Operation: Robotic-assisted laparoscopic total hysterectomy with bilateral salpingoophorectomy, SLN biopsy, omental biopsy   Surgeon: Donaciano Eva  Assistant Surgeon: Lahoma Crocker MD  Anesthesia: GET  Urine Output: 50cc  Operative Findings:  : normal peritoneal survey, abdominal adiposity, no peritoneal nodules including none in the cul de sac (see EPic images), no ascites, 6cm uterus, normal tubes and ovaries, no suspicious appearing nodes. No evidence of extrauterine disease.  Estimated Blood Loss:  20cc      Total IV Fluids: 800 ml         Specimens: uterus, cervix, bilateral tubes and ovaries, right and left obturator SLN, omental biopsy         Complications:  None; patient tolerated the procedure well.         Disposition: PACU - hemodynamically stable.  Procedure Details  The patient was seen in the Holding Room. The risks, benefits, complications, treatment options, and expected outcomes were discussed with the patient.  The patient concurred with the proposed plan, giving informed consent.  The site of surgery properly noted/marked. The patient was identified as Christine Mcknight and the procedure verified as a Robotic-assisted hysterectomy with bilateral salpingo oophorectomy with SLN biopsy. A Time Out was held and the above information confirmed.  After induction of anesthesia, the patient was draped and prepped in the  usual sterile manner. Pt was placed in supine position after anesthesia and draped and prepped in the usual sterile manner. The abdominal drape was placed after the CholoraPrep had been allowed to dry for 3 minutes.  Her arms were tucked to her side with all appropriate precautions.  The shoulders were stabilized with padded shoulder blocks applied to the acromium processes.  The patient was placed in the semi-lithotomy position in [place].  The perineum was prepped with Betadine. The patient was then prepped. Foley catheter was placed.  A sterile speculum was placed in the vagina.  The cervix was grasped with a single-tooth tenaculum. 2mg  total of ICG was injected into the cervical stroma at 2 and 9 o'clock with 1cc injected at a 1cm and 50mm depth (concentration 0.5mg /ml) in all locations. The cervix was dilated with Kennon Rounds dilators.  The ZUMI uterine manipulator with a medium colpotomizer ring was placed without difficulty.  A pneum occluder balloon was placed over the manipulator.  OG tube placement was confirmed and to suction.   Next, a 5 mm skin incision was made 1 cm below the subcostal margin in the midclavicular line.  The 5 mm Optiview port and scope was used for direct entry.  Opening pressure was under 10 mm CO2.  The abdomen was insufflated and the findings were noted as above.   At this point and all points during the procedure, the patient's intra-abdominal pressure did not exceed 15 mmHg. Next, a 10 mm skin incision was made in the umbilicus and a right and left port was placed about 10 cm lateral to the robot port on the right and left side.  A fourth arm was placed  in the left lower quadrant 2 cm above and superior and medial to the anterior superior iliac spine.  All ports were placed under direct visualization.  The patient was placed in steep Trendelenburg.  Bowel was folded away into the upper abdomen.  A comprehensive peritoneal survey was performed. The lesions that were identified on  CT imaging in the peritoneal cavity were not identified. The posterior right liver could not be assessed via this approach, however due to her poorly controlled diabetes and morbid obesity, a diagnostic laparotomy was not felt to be medically appropriate. However there were clearly no omental masses or pelvic cul de sac disease. Images were obtained to document this. The robot was docked in the normal manner.  Washings were obtained.  The right and left peritoneum were opened parallel to the IP ligament to open the retroperitoneal spaces bilaterally. The SLN mapping was performed in bilateral pelvic basins. The para rectal and paravesical spaces were opened up entirely with careful dissection below the level of the ureters bilaterally and to the depth of the uterine artery origin in order to skeletonize the uterine "web" and ensure visualization of all parametrial channels. The para-aortic basins were carefully exposed and evaluated for isolated para-aortic SLN's. Lymphatic channels were identified travelling to the following visualized sentinel lymph node's: right and left obturator SLN's. These SLN's were separated from their surrounding lymphatic tissue, removed and sent for permanent pathology.  The hysterectomy was started after the round ligament on the right side was incised and the retroperitoneum was entered and the pararectal space was developed.  The ureter was noted to be on the medial leaf of the broad ligament.  The peritoneum above the ureter was incised and stretched and the infundibulopelvic ligament was skeletonized, cauterized and cut.  The posterior peritoneum was taken down to the level of the KOH ring.  The anterior peritoneum was also taken down.  The bladder flap was created to the level of the KOH ring.  The uterine artery on the right side was skeletonized, cauterized and cut in the normal manner.  A similar procedure was performed on the left.  The colpotomy was made and the uterus,  cervix, bilateral ovaries and tubes were amputated and delivered through the vagina.  Pedicles were inspected and excellent hemostasis was achieved.    For diagnostic purposes, given the findings on preop CT scan, an omental biopsy was then performed. The omentum was delivered into the mid abdomen and elevated. The parietal peritonum that attaches the omentum to the transverse colon was incised facilitating separation of the mid portion of the omentum from the transverse colon. The bipolar sealing forceps and the scissors were used to skeletonize vascular omental pedicles, seal them and transect them until the midportion of the infracolic omentum had been freed from its transverse colonic attachments to the splenic flexure. The omentum was then placed in an endocatch bag and retrieved from the vagina.  The colpotomy at the vaginal cuff was closed with Vicryl on a CT1 needle in a running manner.  Irrigation was used and excellent hemostasis was achieved.  At this point in the procedure was completed.  Robotic instruments were removed under direct visulaization.  The robot was undocked. The 10 mm ports were closed with Vicryl on a UR-5 needle and the fascia was closed with 0 Vicryl on a UR-5 needle.  The skin was closed with 4-0 Vicryl in a subcuticular manner.  Dermabond was applied.  Sponge, lap and needle counts correct x 2.  The patient was taken to the recovery room in stable condition.  The vagina was swabbed with  minimal bleeding noted.   All instrument and needle counts were correct x  3.   The patient was transferred to the recovery room in a stable condition.  Donaciano Eva, MD

## 2020-01-14 NOTE — Anesthesia Procedure Notes (Signed)
Procedure Name: Intubation Date/Time: 01/14/2020 7:39 AM Performed by: Victoriano Lain, CRNA Pre-anesthesia Checklist: Patient identified, Emergency Drugs available, Suction available, Patient being monitored and Timeout performed Patient Re-evaluated:Patient Re-evaluated prior to induction Oxygen Delivery Method: Circle system utilized Preoxygenation: Pre-oxygenation with 100% oxygen Induction Type: IV induction and Cricoid Pressure applied Ventilation: Mask ventilation without difficulty Laryngoscope Size: Mac and 4 Grade View: Grade II Tube type: Oral Tube size: 7.5 mm Number of attempts: 1 Airway Equipment and Method: Stylet Placement Confirmation: ETT inserted through vocal cords under direct vision,  positive ETCO2 and breath sounds checked- equal and bilateral Secured at: 22 cm Tube secured with: Tape Dental Injury: Teeth and Oropharynx as per pre-operative assessment

## 2020-01-14 NOTE — Progress Notes (Signed)
Patient refused insulin lantus. Stated it does not work for her. MD and pharmacist approved of patient own insulin starting midnight

## 2020-01-14 NOTE — Progress Notes (Signed)
Dr. Barry Dienes paged due to scheduled insulin and sliding scale insulin ordered at the same time and patient would be receiving 17 units and per pt "she would be in the floor". Per MD give scheduled 6 units and hold sliding scale, retake CBG in 2 hours and then give sliding scale.

## 2020-01-14 NOTE — Progress Notes (Signed)
Patient checked her own blood glucose at 8:30pm. It was 336, patient self administered 9 units of fiasp insulin. Md notified of blood sugars and patient insulin. Orders to Discontinue IVF 5%dex with KCl done. IVF 263m bolus orderd and administered, to continue with IV Normal saline 1533mhr. Labs ordered uninalysi, ketone bodies,and B-met. Blood glucose rechecked was 414. MDand pharmacist discussing patient insulin and best way to manage.

## 2020-01-15 ENCOUNTER — Telehealth: Payer: Self-pay | Admitting: Oncology

## 2020-01-15 ENCOUNTER — Other Ambulatory Visit: Payer: Self-pay

## 2020-01-15 DIAGNOSIS — C541 Malignant neoplasm of endometrium: Secondary | ICD-10-CM | POA: Diagnosis not present

## 2020-01-15 LAB — CBC
HCT: 30.7 % — ABNORMAL LOW (ref 36.0–46.0)
Hemoglobin: 10.1 g/dL — ABNORMAL LOW (ref 12.0–15.0)
MCH: 30.9 pg (ref 26.0–34.0)
MCHC: 32.9 g/dL (ref 30.0–36.0)
MCV: 93.9 fL (ref 80.0–100.0)
Platelets: 248 10*3/uL (ref 150–400)
RBC: 3.27 MIL/uL — ABNORMAL LOW (ref 3.87–5.11)
RDW: 12 % (ref 11.5–15.5)
WBC: 9.2 10*3/uL (ref 4.0–10.5)
nRBC: 0 % (ref 0.0–0.2)

## 2020-01-15 LAB — BASIC METABOLIC PANEL
Anion gap: 8 (ref 5–15)
BUN: 16 mg/dL (ref 8–23)
CO2: 26 mmol/L (ref 22–32)
Calcium: 8.7 mg/dL — ABNORMAL LOW (ref 8.9–10.3)
Chloride: 102 mmol/L (ref 98–111)
Creatinine, Ser: 0.83 mg/dL (ref 0.44–1.00)
GFR calc Af Amer: >60 mL/min (ref >60)
GFR calc non Af Amer: >60 mL/min (ref >60)
Glucose, Bld: 266 mg/dL — ABNORMAL HIGH (ref 70–99)
Potassium: 4.9 mmol/L (ref 3.5–5.1)
Sodium: 136 mmol/L (ref 135–145)

## 2020-01-15 LAB — GLUCOSE, CAPILLARY
Glucose-Capillary: 248 mg/dL — ABNORMAL HIGH (ref 70–99)
Glucose-Capillary: 264 mg/dL — ABNORMAL HIGH (ref 70–99)

## 2020-01-15 LAB — CYTOLOGY - NON PAP

## 2020-01-15 MED ORDER — IBUPROFEN 600 MG PO TABS: 600.0000 mg | ORAL_TABLET | Freq: Four times a day (QID) | ORAL | 0 refills | Status: DC | PRN

## 2020-01-15 NOTE — Discharge Instructions (Addendum)
01/14/2020  Return to work: 4 weeks  Continue to monitor your blood sugars at home.  Do not take ibuprofen and mobic together. Use one or the other since they work similarly.  Activity: 1. Be up and out of the bed during the day.  Take a nap if needed.  You may walk up steps but be careful and use the hand rail.  Stair climbing will tire you more than you think, you may need to stop part way and rest.   2. No lifting or straining for 4 weeks.  3. No driving for 1 week.  Do Not drive if you are taking narcotic pain medicine.  4. Shower daily.  Use soap and water on your incision and pat dry; don't rub.   5. No sexual activity and nothing in the vagina for 8 weeks.  Medications:  - Take ibuprofen and tylenol first line for pain control. Take these regularly (every 6 hours) to decrease the build up of pain.  - If necessary, for severe pain not relieved by ibuprofen, take tramadol.  - While taking tramadol you should take sennakot every night to reduce the likelihood of constipation. If this causes diarrhea, stop its use.  Diet: 1. Low sodium Heart Healthy Diet is recommended.  2. It is safe to use a laxative if you have difficulty moving your bowels.   Wound Care: 1. Keep clean and dry.  Shower daily.  Reasons to call the Doctor:   Fever - Oral temperature greater than 100.4 degrees Fahrenheit  Foul-smelling vaginal discharge  Difficulty urinating  Nausea and vomiting  Increased pain at the site of the incision that is unrelieved with pain medicine.  Difficulty breathing with or without chest pain  New calf pain especially if only on one side  Sudden, continuing increased vaginal bleeding with or without clots.   Follow-up: 1. Phone visit in one week. See Everitt Amber in 3 weeks.  Contacts: For questions or concerns you should contact:  Dr. Everitt Amber at [phone]   After hours and on week-ends call [phone] and ask to speak to the physician on call for  Gynecologic Oncology

## 2020-01-15 NOTE — Telephone Encounter (Signed)
Called Dr. Buddy Duty with [facility] regarding patient's blood glucose levels which were running from 436 last night to 264 this morning.  He said to continue her home insulin without any changes.  He also would like to see her for follow up at 11:00 on Friday, 01/17/20.

## 2020-01-15 NOTE — Progress Notes (Signed)
Pt alert and oriented, tolerating diet. D/C instructions given, questions answered. Pt d/cd to home.

## 2020-01-15 NOTE — Progress Notes (Signed)
Patient CBG at 12am was 287, she refused insulin and did not self administer her own insulin as well.

## 2020-01-15 NOTE — Telephone Encounter (Signed)
Called Christine Mcknight and let her know that Dr. Buddy Duty would like to see her on Friday at 11 am or that she can call his office to reschedule.  She verbalized understanding and agreement.

## 2020-01-15 NOTE — Discharge Summary (Signed)
Physician Discharge Summary  Patient ID: Christine Mcknight MRN: [id number] DOB/AGE: 1946/02/08 74 y.o.  Admit date: 01/14/2020 Discharge date: 01/15/2020  Admission Diagnoses: Endometrial cancer [facility])  Discharge Diagnoses:  Principal Problem:   Endometrial cancer ([place]) Active Problems:   Type I diabetes mellitus, uncontrolled ([place])   Morbid obesity ([place])   Discharged Condition:  The patient is in good condition and stable for discharge with her blood glucose levels trending downward.  Hospital Course: On 01/14/2020, the patient underwent the following: Procedure(s): XI ROBOTIC ASSISTED TOTAL HYSTERECTOMY WITH BILATERAL SALPINGO OOPHORECTOMY SENTINEL NODE BIOPSY; OMENTUM BIOPSY.   The postoperative course included elevations in her blood glucose post-operatively as high as 436 likely due to stress from surgery, intraoperative dexamethasone given.  The patient was alert and oriented and administered her home insulin regimen and her glucose levels began to trend downward.  She was discharged to home on postoperative day 1 tolerating a regular diet, voiding, ambulating, passing flatus, no pain reported with follow up planned with her endocrinologist, Dr. Buddy Duty, for Friday.  Consults: None  Significant Diagnostic Studies: Labs  Treatments: surgery: see above  Discharge Exam: Blood pressure 128/60, pulse 60, temperature 97.8 F (36.6 C), temperature source Oral, resp. rate 18, height 5\' 5"  (1.651 m), weight 231 lb 1 oz (104.8 kg), SpO2 100 %. General appearance: alert, cooperative and no distress Resp: clear to auscultation bilaterally Cardio: regular rate and rhythm, S1, S2 normal, no murmur, click, rub or gallop GI: soft, non-tender; bowel sounds normal; no masses,  no organomegaly Extremities: extremities normal, atraumatic, no cyanosis or edema Incision/Wound: Lap sites to the abdomen with dermabond intact without drainage  Disposition: Discharge disposition: 01-Home or Self  Care       Discharge Instructions    Call MD for:  difficulty breathing, headache or visual disturbances   Complete by: As directed    Call MD for:  extreme fatigue   Complete by: As directed    Call MD for:  hives   Complete by: As directed    Call MD for:  persistant dizziness or light-headedness   Complete by: As directed    Call MD for:  persistant nausea and vomiting   Complete by: As directed    Call MD for:  redness, tenderness, or signs of infection (pain, swelling, redness, odor or green/yellow discharge around incision site)   Complete by: As directed    Call MD for:  severe uncontrolled pain   Complete by: As directed    Call MD for:  temperature >100.4   Complete by: As directed    Diet - low sodium heart healthy   Complete by: As directed    Driving Restrictions   Complete by: As directed    No driving for 1 week.  Do not take narcotics and drive.   Increase activity slowly   Complete by: As directed    Lifting restrictions   Complete by: As directed    No lifting greater than 10 lbs for 4 weeks.   Sexual Activity Restrictions   Complete by: As directed    No sexual activity, nothing in the vagina, for 8 weeks.     Allergies as of 01/15/2020   No Known Allergies     Medication List    TAKE these medications   DULoxetine 60 MG capsule Commonly known as: CYMBALTA Take 60 mg by mouth daily.   Fiasp FlexTouch 100 UNIT/ML FlexTouch Pen Generic drug: insulin aspart Inject 5-40 Units into the skin in the  morning, at noon, and at bedtime.   ibuprofen 600 MG tablet Commonly known as: ADVIL Take 1 tablet (600 mg total) by mouth every 6 (six) hours as needed for moderate pain. Do not take with mobic. For AFTER surgery only What changed: additional instructions   losartan 25 MG tablet Commonly known as: Cozaar Take 1 tablet (25 mg total) by mouth daily.   losartan-hydrochlorothiazide 50-12.5 MG tablet Commonly known as: HYZAAR Take 1 tablet by mouth  daily.   meloxicam 7.5 MG tablet Commonly known as: MOBIC Take 7.5 mg by mouth daily.   multivitamin with minerals Tabs tablet Take 2 tablets by mouth daily.   pantoprazole 40 MG tablet Commonly known as: PROTONIX Take 1 tablet (40 mg total) by mouth daily.   pravastatin 20 MG tablet Commonly known as: PRAVACHOL Take 20 mg by mouth daily.   senna-docusate 8.6-50 MG tablet Commonly known as: Senokot-S Take 2 tablets by mouth at bedtime. For AFTER surgery, do not take if having diarrhea   Synthroid 150 MCG tablet Generic drug: levothyroxine Take 150 mcg by mouth daily before breakfast.   traMADol 50 MG tablet Commonly known as: ULTRAM Take 1 tablet (50 mg total) by mouth every 6 (six) hours as needed for severe pain. For AFTER surgery, do not take and drive   Tresiba FlexTouch 100 UNIT/ML FlexTouch Pen Generic drug: insulin degludec Inject 0.4 mLs (40 Units total) into the skin daily. What changed:   how much to take  when to take this      Follow-up Information    Christine Amber, MD Follow up on 01/21/2020.   Specialty: Gynecologic Oncology Why: at 3:30 will be a phone visit with Christine Mcknight to discuss pathology. On 02/04/2020 at 3:30pm, this will be an in person visit at the [facility] with Christine Mcknight for a post-op check. Contact information: [place] [address] [phone]           Greater than thirty minutes were spend for face to face discharge instructions and discharge orders/summary in EPIC.   Signed: Dorothyann Gibbs 01/15/2020, 10:09 AM

## 2020-01-16 ENCOUNTER — Telehealth: Payer: Self-pay

## 2020-01-16 DIAGNOSIS — G4733 Obstructive sleep apnea (adult) (pediatric): Secondary | ICD-10-CM | POA: Diagnosis not present

## 2020-01-16 DIAGNOSIS — G4710 Hypersomnia, unspecified: Secondary | ICD-10-CM | POA: Diagnosis not present

## 2020-01-16 NOTE — Telephone Encounter (Signed)
LM for Christine Mcknight to call back to the office to discuss how she is doing at home post discharge from surgery.

## 2020-01-17 DIAGNOSIS — E109 Type 1 diabetes mellitus without complications: Secondary | ICD-10-CM | POA: Diagnosis not present

## 2020-01-17 DIAGNOSIS — Z794 Long term (current) use of insulin: Secondary | ICD-10-CM | POA: Diagnosis not present

## 2020-01-17 DIAGNOSIS — E039 Hypothyroidism, unspecified: Secondary | ICD-10-CM | POA: Diagnosis not present

## 2020-01-17 DIAGNOSIS — Z9884 Bariatric surgery status: Secondary | ICD-10-CM | POA: Diagnosis not present

## 2020-01-20 ENCOUNTER — Other Ambulatory Visit: Payer: Self-pay | Admitting: Oncology

## 2020-01-20 NOTE — Progress Notes (Signed)
Gynecologic Oncology Multi-Disciplinary Disposition Conference Note  Date of the Conference: 01/20/2020  Patient Name: Christine Mcknight  Referring Provider: Dr. Nelda Marseille Primary GYN Oncologist: Dr. Denman George  Stage/Disposition:  Stage IA, grade 2 endometrioid adenocarcinoma. Disposition is to vaginal brachytherapy +/- NRG GY020 study if she qualifies.  Also referral to genetics.   This Multidisciplinary conference took place involving physicians from [place], [place], Radiation Oncology, Pathology, Radiology along with the Gynecologic Oncology Nurse Practitioner and RN.  Comprehensive assessment of the patient's malignancy, staging, need for surgery, chemotherapy, radiation therapy, and need for further testing were reviewed. Supportive measures, both inpatient and following discharge were also discussed. The recommended plan of care is documented. Greater than 35 minutes were spent correlating and coordinating this patient's care.

## 2020-01-21 ENCOUNTER — Telehealth: Payer: Self-pay | Admitting: *Deleted

## 2020-01-21 ENCOUNTER — Inpatient Hospital Stay: Payer: Medicare HMO | Admitting: Gynecologic Oncology

## 2020-01-21 DIAGNOSIS — C541 Malignant neoplasm of endometrium: Secondary | ICD-10-CM

## 2020-01-21 LAB — SURGICAL PATHOLOGY

## 2020-01-21 NOTE — Telephone Encounter (Signed)
Ms. Argentieri daughter Holley Dexter) called to notify the clinic that her mother's phone is not working today and she is not available for a phone visit today. I returned her daughter's call and she said her mom's phone will be in service tomorrow. I asked her if if we rescheduled the appointment, could she let her mother know the date and time. She stated that she could.  I called back once the appointment was rescheduled, and left a message on her daughter's phone with appointment time and date.

## 2020-01-22 ENCOUNTER — Telehealth: Payer: Self-pay | Admitting: Gynecologic Oncology

## 2020-01-23 ENCOUNTER — Inpatient Hospital Stay (HOSPITAL_BASED_OUTPATIENT_CLINIC_OR_DEPARTMENT_OTHER): Payer: Medicare HMO | Admitting: Gynecologic Oncology

## 2020-01-23 ENCOUNTER — Encounter: Payer: Self-pay | Admitting: Gynecologic Oncology

## 2020-01-23 DIAGNOSIS — Z90710 Acquired absence of both cervix and uterus: Secondary | ICD-10-CM

## 2020-01-23 DIAGNOSIS — Z7189 Other specified counseling: Secondary | ICD-10-CM

## 2020-01-23 DIAGNOSIS — Z90722 Acquired absence of ovaries, bilateral: Secondary | ICD-10-CM

## 2020-01-23 DIAGNOSIS — C541 Malignant neoplasm of endometrium: Secondary | ICD-10-CM

## 2020-01-23 NOTE — Progress Notes (Signed)
Gynecologic Oncology Telehealth Follow-up Note I connected with Christine Mcknight on 01/23/20 at  2:30 PM EDT by telephone and verified that I am speaking with the correct person using two identifiers.  I discussed the limitations, risks, security and privacy concerns of performing an evaluation and management service by telemedicine and the availability of in-person appointments. I also discussed with the patient that there may be a patient responsible charge related to this service. The patient expressed understanding and agreed to proceed.  Other persons participating in the visit and their role in the encounter: none.  Patient's location: home Provider's location: [place]  Chief Complaint:  Chief Complaint  Patient presents with  . endometrial cancer    counseling and coordination of care   CC:  Chief Complaint  Patient presents with  . endometrial cancer    counseling and coordination of care    Assessment/Plan:  Ms. Christine Mcknight  is a 74 y.o.  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 High/intermediate risk factors for recurrence. Recommendation is for vaginal brachytherapy to reduce risk for local recurrence in accordance with NCCN guidelines.  I discussed this with the patient. I discussed the role of adjuvant therapy. I discussed prognosis and risk for recurrence. We reviewed symptoms concerning for recurrence and she will see me if these develop prior to her scheduled appointment.  After completing adjuvant therapy I recommend she follow-up at 3 monthly intervals for symptom review, physical examination and pelvic examination. Pap smear is not recommended in routine endometrial cancer surveillance. After 2 years we will space these visits to every 6 months, and then annually if recurrence has not developed within 5 years. All questions were answered.  HPI: [name] is a 74 year old P2 who was seen in consultation at the  request of Dr Nelda Marseille for evaluation of endometrial cancer (FIGO grade 3).  The patient reported a single episode of postmenopausal bleeding in the middle of the night on approximately March 26th, 2021.  She then saw her primary care physician the following day who scheduled her to see Dr.  Nelda Marseille within the week for an evaluation including ultrasound and endometrial biopsy.  When Dr. Nelda Marseille evaluated the patient she proceeded with an endometrial [place] biopsy due to concern for occult malignancy.  This was performed on December 26, 2019.  This biopsy returned as FIGO grade 3 endometrioid adenocarcinoma.  The patient subsequently had no further bleeding.  The patient has an ultrasound scheduled that she will have at [place] on the same day as this consultation.  The patient's medical history is remarkable for being a type I diabetic.  This is complicated by retinopathy.  As far she is aware she does not have a history of nephropathy or neuropathy.  She sees Dr. Buddy Duty an endocrinologist for close blood glucose control.  Her most recent hemoglobin A1c was 9% approximately 6 months ago.  The patient had a history of a gastric sleeve in 2020 performed laparoscopically with Dr. Josie Saunders.  This was an uncomplicated procedure and resulted in a 40 pound weight loss.  Her additional surgical history is remarkable for 2 prior cesarean sections.  Her medical history is also significant for fibromyalgia, hypothyroidism, obstructive sleep apnea for which she uses a CPAP device, and depression and anxiety.  She is obese with a BMI of 39 kg per metered squared.  Her family history is remarkable for a mother with ovarian cancer in her early 57s which  was stage IV she required chemotherapy and developed myelodysplasia.  The patient lives in [place] and is a Probation officer of historical fiction novels.  She lives with her daughter who is of good health.  Interval Hx:  Preoperative CT scan was ordered and showed findings  suggestive of stage IV cancer (with peritoneal nodularity, omental nodularity).   On 01/14/20 she underwent robotic assisted total hysterectomy, BSO, SLN biopsy, omental biopsy, peritoneal biopsy. Intraoperative findings were significant for no gross extrauterine disease, no peritoneal nodularity, no ascites, no enlarged lymph nodes, normal-appearing omentum. Surgery was uncomplicated, She stayed overnight as a planned admission/observation for poorly controlled diabetes.  Final pathology revealed a FIGO grade2 endometrioid tumor measuring 2.5cm and involving the inner half of the myometrium (37mm of 68mm thickness). The ovaries, cervix, omentum, peritoneal biopsies and all lymph nodes were negative.  Her pathology was reviewed at multidisciplinary tumor conference as were her preoperative CT scans.  It was determined that these were false positive findings after reviewed by a second radiologist.  She was determined to have high intermediate risk factors in her uterine specimen and therefore adjuvant vaginal brachytherapy was recommended in accordance with NCCN guidelines.  Since surgery she has done well with no complaints.  Current Meds:  Outpatient Encounter Medications as of 01/23/2020  Medication Sig  . DULoxetine (CYMBALTA) 60 MG capsule Take 60 mg by mouth daily.  Marland Kitchen FIASP FLEXTOUCH 100 UNIT/ML SOPN Inject 5-40 Units into the skin in the morning, at noon, and at bedtime.   Marland Kitchen ibuprofen (ADVIL) 600 MG tablet Take 1 tablet (600 mg total) by mouth every 6 (six) hours as needed for moderate pain. Do not take with mobic. For AFTER surgery only  . insulin degludec (TRESIBA FLEXTOUCH) 100 UNIT/ML SOPN FlexTouch Pen Inject 0.4 mLs (40 Units total) into the skin daily. (Patient taking differently: Inject 48 Units into the skin in the morning. )  . losartan (COZAAR) 25 MG tablet Take 1 tablet (25 mg total) by mouth daily. (Patient not taking: Reported on 01/08/2020)  . losartan-hydrochlorothiazide (HYZAAR)  50-12.5 MG tablet Take 1 tablet by mouth daily.  . meloxicam (MOBIC) 7.5 MG tablet Take 7.5 mg by mouth daily.  . Multiple Vitamin (MULTIVITAMIN WITH MINERALS) TABS tablet Take 2 tablets by mouth daily.  . pantoprazole (PROTONIX) 40 MG tablet Take 1 tablet (40 mg total) by mouth daily.  . pravastatin (PRAVACHOL) 20 MG tablet Take 20 mg by mouth daily.  Marland Kitchen senna-docusate (SENOKOT-S) 8.6-50 MG tablet Take 2 tablets by mouth at bedtime. For AFTER surgery, do not take if having diarrhea  . SYNTHROID 150 MCG tablet Take 150 mcg by mouth daily before breakfast.   . traMADol (ULTRAM) 50 MG tablet Take 1 tablet (50 mg total) by mouth every 6 (six) hours as needed for severe pain. For AFTER surgery, do not take and drive   No facility-administered encounter medications on file as of 01/23/2020.    Allergy: No Known Allergies  Social Hx:   Social History   Socioeconomic History  . Marital status: Single    Spouse name: Not on file  . Number of children: Not on file  . Years of education: Not on file  . Highest education level: Not on file  Occupational History  . Not on file  Tobacco Use  . Smoking status: Never Smoker  . Smokeless tobacco: Never Used  Substance and Sexual Activity  . Alcohol use: Not Currently    Comment: occasionally  . Drug use: Never  .  Sexual activity: Not Currently  Other Topics Concern  . Not on file  Social History Narrative  . Not on file   Social Determinants of Health   Financial Resource Strain:   . Difficulty of Paying Living Expenses:   Food Insecurity:   . Worried About Charity fundraiser in the Last Year:   . Arboriculturist in the Last Year:   Transportation Needs:   . Film/video editor (Medical):   Marland Kitchen Lack of Transportation (Non-Medical):   Physical Activity:   . Days of Exercise per Week:   . Minutes of Exercise per Session:   Stress:   . Feeling of Stress :   Social Connections:   . Frequency of Communication with Friends and Family:    . Frequency of Social Gatherings with Friends and Family:   . Attends Religious Services:   . Active Member of Clubs or Organizations:   . Attends Archivist Meetings:   Marland Kitchen Marital Status:   Intimate Partner Violence:   . Fear of Current or Ex-Partner:   . Emotionally Abused:   Marland Kitchen Physically Abused:   . Sexually Abused:     Past Surgical Hx:  Past Surgical History:  Procedure Laterality Date  . CESAREAN SECTION     X2  . DILATION AND CURETTAGE OF UTERUS     x 2  . LAPAROSCOPIC GASTRIC SLEEVE RESECTION N/A 06/25/2019   Procedure: LAPAROSCOPIC GASTRIC SLEEVE RESECTION WITH HIATAL HERNIA REPAIR AND UPPER ENDOSCOPY, ERAS Pathway;  Surgeon: Clovis Riley, MD;  Location: [facility];  Service: General;  Laterality: N/A;  . ROBOTIC ASSISTED TOTAL HYSTERECTOMY WITH BILATERAL SALPINGO OOPHERECTOMY Bilateral 01/14/2020   Procedure: XI ROBOTIC ASSISTED TOTAL HYSTERECTOMY WITH BILATERAL SALPINGO OOPHORECTOMY;  Surgeon: Everitt Amber, MD;  Location: [facility];  Service: Gynecology;  Laterality: Bilateral;  . SENTINEL NODE BIOPSY N/A 01/14/2020   Procedure: SENTINEL NODE BIOPSY; OMENTUM BIOPSY;  Surgeon: Everitt Amber, MD;  Location: [facility];  Service: Gynecology;  Laterality: N/A;  . TONSILLECTOMY      Past Medical Hx:  Past Medical History:  Diagnosis Date  . Anxiety   . Arthritis   . Complication of anesthesia    tends to aspirate  . Depression   . Diabetes mellitus without complication ([place])    type 1  dexcom continous glucose meter  . endometrial/uterine ca dx'd 12/2019  . Fibromyalgia    neuropathy eyes  . Headache    history of migraines  . Hypertension   . Hypothyroidism   . PONV (postoperative nausea and vomiting)   . Sleep apnea    cpap    Past Gynecological History:  Menopause at age 26, 2 prior cesarean sections. No LMP recorded. Patient is postmenopausal.  Family Hx:  Family History  Problem Relation Age of Onset  . Cancer Other   . Diabetes Other   . Cataracts  Mother   . Ovarian cancer Mother   . Cataracts Father     Review of Systems:  Constitutional  Feels well,    ENT Normal appearing ears and nares bilaterally Skin/Breast  No rash, sores, jaundice, itching, dryness Cardiovascular  No chest pain, shortness of breath, or edema  Pulmonary  No cough or wheeze.  Gastro Intestinal  No nausea, vomitting, or diarrhoea. No bright red blood per rectum, no abdominal pain, change in bowel movement, or constipation.  Genito Urinary  No frequency, urgency, dysuria, + postmenopausal bleeding Musculo Skeletal  No myalgia, arthralgia, joint swelling or  pain  Neurologic  No weakness, numbness, change in gait,  Psychology  No depression, anxiety, insomnia.   Vitals:  There were no vitals taken for this visit.  Physical Exam: Deferred  I discussed the assessment and treatment plan with the patient. The patient was provided with an opportunity to ask questions and all were answered. The patient agreed with the plan and demonstrated an understanding of the instructions.   The patient was advised to call back or see an in-person evaluation if the symptoms worsen or if the condition fails to improve as anticipated.   I provided 15 minutes of non face-to-face telephone visit time during this encounter, and > 50% was spent counseling as documented under my assessment & plan.    Thereasa Solo, MD  01/23/2020, 3:17 PM

## 2020-01-24 ENCOUNTER — Encounter: Payer: Self-pay | Admitting: Oncology

## 2020-01-24 ENCOUNTER — Encounter (HOSPITAL_COMMUNITY): Payer: Self-pay | Admitting: Gynecologic Oncology

## 2020-01-24 DIAGNOSIS — C541 Malignant neoplasm of endometrium: Secondary | ICD-10-CM

## 2020-01-28 ENCOUNTER — Ambulatory Visit: Payer: Medicare HMO | Admitting: Skilled Nursing Facility1

## 2020-01-29 ENCOUNTER — Ambulatory Visit
Admission: RE | Admit: 2020-01-29 | Discharge: 2020-01-29 | Disposition: A | Discharge status: Home / Self Care | Payer: Medicare HMO | Source: Ambulatory Visit | Attending: Radiation Oncology | Admitting: Radiation Oncology

## 2020-01-29 ENCOUNTER — Other Ambulatory Visit: Payer: Self-pay

## 2020-01-29 ENCOUNTER — Encounter: Payer: Self-pay | Admitting: Radiation Oncology

## 2020-01-29 VITALS — BP 157/92 | HR 71 | Temp 98.3°F | Resp 20 | Ht 65.0 in | Wt 232.4 lb

## 2020-01-29 DIAGNOSIS — C786 Secondary malignant neoplasm of retroperitoneum and peritoneum: Secondary | ICD-10-CM | POA: Insufficient documentation

## 2020-01-29 DIAGNOSIS — I10 Essential (primary) hypertension: Secondary | ICD-10-CM | POA: Diagnosis not present

## 2020-01-29 DIAGNOSIS — Z794 Long term (current) use of insulin: Secondary | ICD-10-CM | POA: Insufficient documentation

## 2020-01-29 DIAGNOSIS — F419 Anxiety disorder, unspecified: Secondary | ICD-10-CM | POA: Insufficient documentation

## 2020-01-29 DIAGNOSIS — I700 Atherosclerosis of aorta: Secondary | ICD-10-CM | POA: Diagnosis not present

## 2020-01-29 DIAGNOSIS — E119 Type 2 diabetes mellitus without complications: Secondary | ICD-10-CM | POA: Diagnosis not present

## 2020-01-29 DIAGNOSIS — C541 Malignant neoplasm of endometrium: Secondary | ICD-10-CM | POA: Diagnosis not present

## 2020-01-29 DIAGNOSIS — E039 Hypothyroidism, unspecified: Secondary | ICD-10-CM | POA: Diagnosis not present

## 2020-01-29 DIAGNOSIS — R69 Illness, unspecified: Secondary | ICD-10-CM | POA: Diagnosis not present

## 2020-01-29 DIAGNOSIS — Z79899 Other long term (current) drug therapy: Secondary | ICD-10-CM | POA: Insufficient documentation

## 2020-01-29 DIAGNOSIS — M129 Arthropathy, unspecified: Secondary | ICD-10-CM | POA: Diagnosis not present

## 2020-01-29 DIAGNOSIS — G4730 Sleep apnea, unspecified: Secondary | ICD-10-CM | POA: Insufficient documentation

## 2020-01-29 DIAGNOSIS — Z90710 Acquired absence of both cervix and uterus: Secondary | ICD-10-CM | POA: Diagnosis not present

## 2020-01-29 DIAGNOSIS — M797 Fibromyalgia: Secondary | ICD-10-CM | POA: Diagnosis not present

## 2020-01-29 NOTE — Progress Notes (Signed)
Radiation Oncology         [phone] ________________________________  Initial Outpatient Consultation  Name: Christine Mcknight MRN: [id number]  Date: 01/29/2020  DOB: 06-06-1946  CC:Jonathon Jordan, MD  Everitt Amber, MD   REFERRING PHYSICIAN: Everitt Amber, MD  DIAGNOSIS: The encounter diagnosis was Endometrial cancer [facility]).  FIGO stage IA (pT1a, pN0) endometrioid endometrial cancer, grade 2  HISTORY OF PRESENT ILLNESS::Christine Mcknight is a 74 y.o. female who is accompanied by no one due to COVID-19 restrictions. The patient presented to Dr. Nelda Marseille on 12/26/2019 for evaluation of a single episode of postmenopausal bleeding on 12/20/2019. Endometrial biopsy revealed FIGO grade 3 endometrioid adenocarcinoma.  The patient was referred to Dr. Denman George and was seen in consultation on 01/07/2020, during which time it was recommended that she proceed with surgical intervention followed by adjuvant therapy.  CT of chest/abdomen/pelvis on 01/13/2020 showed multiple, small, round, solid nodules within the abdomen and pelvis that were compatible with peritoneal carcinomatosis. There was no evidence for solid organ metastasis or metastatic disease to the chest.  The patient underwent a robotic-assisted laparoscopic total hysterectomy with bilateral salpingo-oophorectomy, sentinel lymph node biopsy, and omental biopsy on 01/14/2020 performed by Dr. Denman George. Pathology from the procedure revealed FIGO grade 2 endometrioid adenocarcinoma that involved the inner half of the myometrium. Surgical resection margins were negative for carcinoma. One left obturator sentinel lymph node and one right obturator sentinel lymph node was biopsied, both of which were negative for carcinoma.  Omentum resection showed no evidence of malignancy.  The patient's case was presented at the gynecologic oncology multi-disciplinary conference on 01/20/2020.  Preoperative scans were reviewed and additional interpretation was that the peritoneal  nodules were less consistent with malignancy. it was recommended that the patient proceed with genetic testing and vaginal brachytherapy +/- NRG GY020 study, if she qualifies.  PREVIOUS RADIATION THERAPY: No  PAST MEDICAL HISTORY:  Past Medical History:  Diagnosis Date   Anxiety    Arthritis    Complication of anesthesia    tends to aspirate   Depression    Diabetes mellitus without complication ([place])    type 1  dexcom continous glucose meter   endometrial/uterine ca dx'd 12/2019   Fibromyalgia    neuropathy eyes   Headache    history of migraines   Hypertension    Hypothyroidism    PONV (postoperative nausea and vomiting)    Sleep apnea    cpap    PAST SURGICAL HISTORY: Past Surgical History:  Procedure Laterality Date   CESAREAN SECTION     X2   DILATION AND CURETTAGE OF UTERUS     x 2   LAPAROSCOPIC GASTRIC SLEEVE RESECTION N/A 06/25/2019   Procedure: LAPAROSCOPIC GASTRIC SLEEVE RESECTION WITH HIATAL HERNIA REPAIR AND UPPER ENDOSCOPY, ERAS Pathway;  Surgeon: Clovis Riley, MD;  Location: [facility];  Service: General;  Laterality: N/A;   ROBOTIC ASSISTED TOTAL HYSTERECTOMY WITH BILATERAL SALPINGO OOPHERECTOMY Bilateral 01/14/2020   Procedure: XI ROBOTIC ASSISTED TOTAL HYSTERECTOMY WITH BILATERAL SALPINGO OOPHORECTOMY;  Surgeon: Everitt Amber, MD;  Location: [facility];  Service: Gynecology;  Laterality: Bilateral;   SENTINEL NODE BIOPSY N/A 01/14/2020   Procedure: SENTINEL NODE BIOPSY; OMENTUM BIOPSY;  Surgeon: Everitt Amber, MD;  Location: [facility];  Service: Gynecology;  Laterality: N/A;   TONSILLECTOMY      FAMILY HISTORY:  Family History  Problem Relation Age of Onset   Cancer Other    Diabetes Other    Cataracts Mother    Ovarian cancer Mother  Cataracts Father     SOCIAL HISTORY:  Social History   Tobacco Use   Smoking status: Never Smoker   Smokeless tobacco: Never Used  Substance Use Topics   Alcohol use: Not Currently    Comment:  occasionally   Drug use: Never    ALLERGIES: No Known Allergies  MEDICATIONS:  Current Outpatient Medications  Medication Sig Dispense Refill   DULoxetine (CYMBALTA) 60 MG capsule Take 60 mg by mouth daily.     FIASP FLEXTOUCH 100 UNIT/ML SOPN Inject 5-40 Units into the skin in the morning, at noon, and at bedtime.      insulin degludec (TRESIBA FLEXTOUCH) 100 UNIT/ML SOPN FlexTouch Pen Inject 0.4 mLs (40 Units total) into the skin daily. (Patient taking differently: Inject 48 Units into the skin in the morning. )     losartan-hydrochlorothiazide (HYZAAR) 50-12.5 MG tablet Take 1 tablet by mouth daily.     Multiple Vitamin (MULTIVITAMIN WITH MINERALS) TABS tablet Take 2 tablets by mouth daily.     pantoprazole (PROTONIX) 40 MG tablet Take 1 tablet (40 mg total) by mouth daily. 90 tablet 4   pravastatin (PRAVACHOL) 20 MG tablet Take 20 mg by mouth daily.     SYNTHROID 150 MCG tablet Take 150 mcg by mouth daily before breakfast.      ibuprofen (ADVIL) 600 MG tablet Take 1 tablet (600 mg total) by mouth every 6 (six) hours as needed for moderate pain. Do not take with mobic. For AFTER surgery only (Patient not taking: Reported on 01/29/2020) 30 tablet 0   losartan (COZAAR) 25 MG tablet Take 1 tablet (25 mg total) by mouth daily. (Patient not taking: Reported on 01/29/2020) 30 tablet 0   meloxicam (MOBIC) 7.5 MG tablet Take 7.5 mg by mouth daily.     senna-docusate (SENOKOT-S) 8.6-50 MG tablet Take 2 tablets by mouth at bedtime. For AFTER surgery, do not take if having diarrhea (Patient not taking: Reported on 01/29/2020) 30 tablet 1   traMADol (ULTRAM) 50 MG tablet Take 1 tablet (50 mg total) by mouth every 6 (six) hours as needed for severe pain. For AFTER surgery, do not take and drive (Patient not taking: Reported on 01/29/2020) 10 tablet 0   No current facility-administered medications for this encounter.    REVIEW OF SYSTEMS:  A 10+ POINT REVIEW OF SYSTEMS WAS OBTAINED including  neurology, dermatology, psychiatry, cardiac, respiratory, lymph, extremities, GI, GU, musculoskeletal, constitutional, reproductive, HEENT.  She denies any abdominal or pelvic pain.  She denies any vaginal discharge or bleeding.  She denies any problems with swelling in her lower extremities or numbness in her thighs.   PHYSICAL EXAM:  height is 5\' 5"  (1.651 m) and weight is 232 lb 6.4 oz (105.4 kg). Her temperature is 98.3 F (36.8 C). Her blood pressure is 157/92 (abnormal) and her pulse is 71. Her respiration is 20 and oxygen saturation is 98%.   General: Alert and oriented, in no acute distress HEENT: Head is normocephalic. Extraocular movements are intact.  Neck: Neck is supple, no palpable cervical or supraclavicular lymphadenopathy. Heart: Regular in rate and rhythm with no murmurs, rubs, or gallops. Chest: Clear to auscultation bilaterally, with no rhonchi, wheezes, or rales. Abdomen: Soft, nontender, nondistended, with no rigidity or guarding. Extremities: No cyanosis or edema. Lymphatics: see Neck Exam Skin: No concerning lesions. Musculoskeletal: symmetric strength and muscle tone throughout. Neurologic: Cranial nerves II through XII are grossly intact. No obvious focalities. Speech is fluent. Coordination is intact. Psychiatric: Judgment and insight  are intact. Affect is appropriate. Pelvic exam deferred in light of recent surgery.   ECOG = 1  0 - Asymptomatic (Fully active, able to carry on all predisease activities without restriction)  1 - Symptomatic but completely ambulatory (Restricted in physically strenuous activity but ambulatory and able to carry out work of a light or sedentary nature. For example, light housework, office work)  2 - Symptomatic, <50% in bed during the day (Ambulatory and capable of all self care but unable to carry out any work activities. Up and about more than 50% of waking hours)  3 - Symptomatic, >50% in bed, but not bedbound (Capable of only  limited self-care, confined to bed or chair 50% or more of waking hours)  4 - Bedbound (Completely disabled. Cannot carry on any self-care. Totally confined to bed or chair)  5 - Death   Eustace Pen MM, Creech RH, Tormey DC, et al. [phone]). "Toxicity and response criteria of the [facility]". [place] Oncol. 5 (6): 649-55  LABORATORY DATA:  Lab Results  Component Value Date   WBC 9.2 01/15/2020   HGB 10.1 (L) 01/15/2020   HCT 30.7 (L) 01/15/2020   MCV 93.9 01/15/2020   PLT 248 01/15/2020   NEUTROABS 9.4 (H) 06/26/2019   Lab Results  Component Value Date   NA 136 01/15/2020   K 4.9 01/15/2020   CL 102 01/15/2020   CO2 26 01/15/2020   GLUCOSE 266 (H) 01/15/2020   CREATININE 0.83 01/15/2020   CALCIUM 8.7 (L) 01/15/2020      RADIOGRAPHY: CT Chest W Contrast  Result Date: 01/13/2020 CLINICAL DATA:  High-grade endometrial carcinoma. Evaluate for distant metastatic disease. EXAM: CT CHEST, ABDOMEN, AND PELVIS WITH CONTRAST TECHNIQUE: Multidetector CT imaging of the chest, abdomen and pelvis was performed following the standard protocol during bolus administration of intravenous contrast. CONTRAST:  132mL OMNIPAQUE IOHEXOL 300 MG/ML  SOLN COMPARISON:  None FINDINGS: CT CHEST FINDINGS Cardiovascular: The heart size appears within normal limits. Aortic atherosclerosis. Lad and RCA coronary artery atherosclerotic calcifications. Mediastinum/Nodes: Normal appearance of the thyroid gland. The trachea appears patent and is midline. Normal appearance of the esophagus. No enlarged mediastinal or hilar lymph nodes. Lungs/Pleura: No pleural effusion. Calcified granuloma identified in the right middle lobe, image 73/6. No suspicious lung nodules. Musculoskeletal: Mild spondylosis within the lower thoracic spine. No suspicious bone lesions identified. CT ABDOMEN PELVIS FINDINGS Hepatobiliary: No suspicious liver lesion. Gallbladder negative. No biliary dilatation. Pancreas:  Unremarkable. No pancreatic ductal dilatation or surrounding inflammatory changes. Spleen: Normal in size without focal abnormality. Adrenals/Urinary Tract: Normal appearance of the adrenal glands. No kidney mass or hydronephrosis. Urinary bladder unremarkable. 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 No dilated loops of small or large bowel. No bowel wall thickening, inflammation or distension. Vascular/Lymphatic: Aortic atherosclerosis. No aneurysm. Scattered small, less than 1 cm retroperitoneal lymph nodes. No pelvic or inguinal adenopathy identified. Reproductive: The uterus appears unremarkable. No adnexal mass Other: No ascites. Multiple small round solid nodules are identified within the abdomen. Index nodule adjacent to posterior right hepatic lobe measures 1 cm, image 55/4. Soft tissue nodule within the left upper quadrant of the abdomen measures 8 mm, image 48/7. Nodular peritoneal thickening within the right hemipelvis is noted, image 84/7. Soft tissue nodule within the posterior right pelvis adjacent to the uterus measures 1.2 cm, image 105/4. Musculoskeletal: No acute or significant osseous findings. IMPRESSION: 1. Multiple small round solid nodules within the abdomen and pelvis are identified compatible with peritoneal carcinomatosis.  2. No evidence for solid organ metastasis or metastatic disease to the chest. 3. Multi vessel coronary artery atherosclerotic calcifications noted. Aortic Atherosclerosis (ICD10-I70.0). Electronically Signed   By: Kerby Moors M.D.   On: 01/13/2020 16:20   CT Abdomen Pelvis W Contrast  Result Date: 01/13/2020 CLINICAL DATA:  High-grade endometrial carcinoma. Evaluate for distant metastatic disease. EXAM: CT CHEST, ABDOMEN, AND PELVIS WITH CONTRAST TECHNIQUE: Multidetector CT imaging of the chest, abdomen and pelvis was performed following the standard protocol during bolus administration of intravenous contrast. CONTRAST:  174mL OMNIPAQUE IOHEXOL 300  MG/ML  SOLN COMPARISON:  None FINDINGS: CT CHEST FINDINGS Cardiovascular: The heart size appears within normal limits. Aortic atherosclerosis. Lad and RCA coronary artery atherosclerotic calcifications. Mediastinum/Nodes: Normal appearance of the thyroid gland. The trachea appears patent and is midline. Normal appearance of the esophagus. No enlarged mediastinal or hilar lymph nodes. Lungs/Pleura: No pleural effusion. Calcified granuloma identified in the right middle lobe, image 73/6. No suspicious lung nodules. Musculoskeletal: Mild spondylosis within the lower thoracic spine. No suspicious bone lesions identified. CT ABDOMEN PELVIS FINDINGS Hepatobiliary: No suspicious liver lesion. Gallbladder negative. No biliary dilatation. Pancreas: Unremarkable. No pancreatic ductal dilatation or surrounding inflammatory changes. Spleen: Normal in size without focal abnormality. Adrenals/Urinary Tract: Normal appearance of the adrenal glands. No kidney mass or hydronephrosis. Urinary bladder unremarkable. 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 No dilated loops of small or large bowel. No bowel wall thickening, inflammation or distension. Vascular/Lymphatic: Aortic atherosclerosis. No aneurysm. Scattered small, less than 1 cm retroperitoneal lymph nodes. No pelvic or inguinal adenopathy identified. Reproductive: The uterus appears unremarkable. No adnexal mass Other: No ascites. Multiple small round solid nodules are identified within the abdomen. Index nodule adjacent to posterior right hepatic lobe measures 1 cm, image 55/4. Soft tissue nodule within the left upper quadrant of the abdomen measures 8 mm, image 48/7. Nodular peritoneal thickening within the right hemipelvis is noted, image 84/7. Soft tissue nodule within the posterior right pelvis adjacent to the uterus measures 1.2 cm, image 105/4. Musculoskeletal: No acute or significant osseous findings. IMPRESSION: 1. Multiple small round solid nodules  within the abdomen and pelvis are identified compatible with peritoneal carcinomatosis. 2. No evidence for solid organ metastasis or metastatic disease to the chest. 3. Multi vessel coronary artery atherosclerotic calcifications noted. Aortic Atherosclerosis (ICD10-I70.0). Electronically Signed   By: Kerby Moors M.D.   On: 01/13/2020 16:20      IMPRESSION: FIGO stage IA (pT1a, pN0) endometrioid endometrial cancer, grade 2  Given the findings of a grade 2 tumor and the patient's age over 62 she would be intermediate/ high risk for recurrence and would recommend vaginal brachytherapy to reduce her chances for recurrence.  Today, I talked to the patient  about the findings and work-up thus far.  We discussed the natural history of endometrial cancer and general treatment, highlighting the role of radiotherapy (brachytherapy) in the management.  We discussed the available radiation techniques, and focused on the details of logistics and delivery.  We reviewed the anticipated acute and late sequelae associated with radiation in this setting.  The patient was encouraged to ask questions that I answered to the best of my ability.  A patient consent form was discussed and signed.  We retained a copy for our records.  The patient would like to proceed with radiation and will be scheduled for CT simulation.  PLAN: Patient will be scheduled for vaginal brachytherapy to begin approximately 6 weeks postop.  Anticipate 5 high-dose-rate treatments directed at  the vaginal cuff.  Iridium 192 will be the high-dose-rate source.    ------------------------------------------------  Blair Promise, PhD, MD  This document serves as a record of services personally performed by Gery Pray, MD. It was created on his behalf by Clerance Lav, a trained medical scribe. The creation of this record is based on the scribe's personal observations and the provider's statements to them. This document has been checked and approved  by the attending provider.

## 2020-01-29 NOTE — Progress Notes (Signed)
GYN Location of Tumor / Histology: Endometrial  Christine Mcknight presented with symptoms of: The patient reported a single episode of postmenopausal bleeding in the middle of the night on approximately March 26th, 2021.  She then saw her primary care physician the following day who scheduled her to see Dr.  Nelda Marseille within the week for an evaluation including ultrasound and endometrial biopsy.  When Dr. Nelda Marseille evaluated the patient she proceeded with an endometrial [place] biopsy due to concern for occult malignancy.  This was performed on December 26, 2019.   Biopsies revealed:    Past/Anticipated interventions by Gyn/Onc surgery, if any:  01/14/2020 Dr. Denman George Total hysterectomy with bilateral salpingo-oopherectomy. Sentinel node and omentum biopsies.   Post op follow up 02/04/20 with Dr. Denman George  Past/Anticipated interventions by medical oncology, if any: No  Weight changes, if any: Bariatic surgery 06/25/2019 lost 45 lbs  Bowel/Bladder complaints, if any: , No Nausea/Vomiting, if any: No  Pain issues, if any: No SAFETY ISSUES:  Prior radiation? No  Pacemaker/ICD No  Possible current pregnancy? Hysterectomy  Is the patient on methotrexate? No   BP (!) 157/92 (BP Location: Left Arm, Patient Position: Sitting, Cuff Size: Large)   Pulse 71   Temp 98.3 F (36.8 C)   Resp 20   Ht 5\' 5"  (1.651 m)   Wt 232 lb 6.4 oz (105.4 kg)   SpO2 98%   BMI 38.67 kg/m   Wt Readings from Last 3 Encounters:  01/29/20 232 lb 6.4 oz (105.4 kg)  01/14/20 231 lb 1 oz (104.8 kg)  01/10/20 231 lb 1 oz (104.8 kg)    Current Complaints / other details:

## 2020-02-03 ENCOUNTER — Telehealth: Payer: Self-pay | Admitting: *Deleted

## 2020-02-03 NOTE — Telephone Encounter (Signed)
[place] Psychosocial Distress Screening Clinical Social Work  Clinical Social Work was referred by distress screening protocol.  The patient scored a 5 on the Psychosocial Distress Thermometer which indicatesmoderate distress. Clinical Social Worker attempted to contact patient by phone to assess for distress and other psychosocial needs. CSW left voicemail to return phone call.  ON[facility]N DISTRESS SCREENING 01/29/2020  Screening Type Initial Screening  Distress experienced in past week (1-10) 5  Emotional problem type Nervousness/Anxiety;Adjusting to illness  Spiritual/Religous concerns type Facing my mortality  Other [phone]      Gwinda Maine, LCSW

## 2020-02-04 ENCOUNTER — Encounter: Payer: Self-pay | Admitting: Gynecologic Oncology

## 2020-02-04 ENCOUNTER — Other Ambulatory Visit: Payer: Self-pay

## 2020-02-04 ENCOUNTER — Inpatient Hospital Stay: Payer: Medicare HMO | Attending: Gynecologic Oncology | Admitting: Gynecologic Oncology

## 2020-02-04 VITALS — BP 168/85 | HR 64 | Temp 98.7°F | Resp 17 | Ht 65.0 in | Wt 230.4 lb

## 2020-02-04 DIAGNOSIS — B3731 Acute candidiasis of vulva and vagina: Secondary | ICD-10-CM

## 2020-02-04 DIAGNOSIS — M797 Fibromyalgia: Secondary | ICD-10-CM | POA: Insufficient documentation

## 2020-02-04 DIAGNOSIS — E039 Hypothyroidism, unspecified: Secondary | ICD-10-CM | POA: Insufficient documentation

## 2020-02-04 DIAGNOSIS — I10 Essential (primary) hypertension: Secondary | ICD-10-CM | POA: Insufficient documentation

## 2020-02-04 DIAGNOSIS — E119 Type 2 diabetes mellitus without complications: Secondary | ICD-10-CM | POA: Insufficient documentation

## 2020-02-04 DIAGNOSIS — B373 Candidiasis of vulva and vagina: Secondary | ICD-10-CM | POA: Diagnosis not present

## 2020-02-04 DIAGNOSIS — C541 Malignant neoplasm of endometrium: Secondary | ICD-10-CM

## 2020-02-04 DIAGNOSIS — F329 Major depressive disorder, single episode, unspecified: Secondary | ICD-10-CM | POA: Diagnosis not present

## 2020-02-04 DIAGNOSIS — R69 Illness, unspecified: Secondary | ICD-10-CM | POA: Diagnosis not present

## 2020-02-04 DIAGNOSIS — M1990 Unspecified osteoarthritis, unspecified site: Secondary | ICD-10-CM | POA: Insufficient documentation

## 2020-02-04 DIAGNOSIS — Z90722 Acquired absence of ovaries, bilateral: Secondary | ICD-10-CM | POA: Diagnosis not present

## 2020-02-04 DIAGNOSIS — Z90710 Acquired absence of both cervix and uterus: Secondary | ICD-10-CM | POA: Insufficient documentation

## 2020-02-04 DIAGNOSIS — Z7189 Other specified counseling: Secondary | ICD-10-CM

## 2020-02-04 MED ORDER — FLUCONAZOLE 100 MG PO TABS: 150.0000 mg | ORAL_TABLET | Freq: Every day | ORAL | 1 refills | Status: DC

## 2020-02-04 NOTE — Patient Instructions (Signed)
For the vaginal yeast use either monistat or, if not working, Dr Denman George has phoned in a prescription for a tablet to take by mouth (single dose).  It is safe to place the ovule or cream inside the vagina.  Otherwise no intercourse for an additional 4 weeks.  Dr Denman George will see you for follow-up scheduled by Dr Sondra Come after you have finished your treatments.

## 2020-02-04 NOTE — Progress Notes (Signed)
Gynecologic Oncology Follow-up Note  Chief Complaint:  Chief Complaint  Patient presents with  . Endometrial cancer ([place])    Follow up   Assessment/Plan:  Christine Mcknight  is a 74 y.o.  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 High/intermediate risk factors for recurrence. Recommendation is for vaginal brachytherapy to reduce risk for local recurrence in accordance with NCCN guidelines. She is cleared to commence this at 6 weeks postop.  I discussed this with the patient. I discussed the role of adjuvant therapy. I discussed prognosis and risk for recurrence. We reviewed symptoms concerning for recurrence and she will see me if these develop prior to her scheduled appointment.  After completing adjuvant therapy I recommend she follow-up at 3 monthly intervals for symptom review, physical examination and pelvic examination. Pap smear is not recommended in routine endometrial cancer surveillance. After 2 years we will space these visits to every 6 months, and then annually if recurrence has not developed within 5 years.  For her vaginal yeast I recommended good blood glucose control, vaginal monistat, and if this doesn't work, I'd consider diflucan which was ordered. All questions were answered.  HPI: [name] is a 74 year old P2 who was seen in consultation at the request of Dr Nelda Marseille for evaluation of endometrial cancer (FIGO grade 3).  The patient reported a single episode of postmenopausal bleeding in the middle of the night on approximately March 26th, 2021.  She then saw her primary care physician the following day who scheduled her to see Dr.  Nelda Marseille within the week for an evaluation including ultrasound and endometrial biopsy.  When Dr. Nelda Marseille evaluated the patient she proceeded with an endometrial [place] biopsy due to concern for occult malignancy.  This was performed on December 26, 2019.  This biopsy returned as FIGO grade 3 endometrioid  adenocarcinoma.  The patient subsequently had no further bleeding.  The patient has an ultrasound scheduled that she will have at [facility] on the same day as this consultation.  The patient's medical history is remarkable for being a type I diabetic.  This is complicated by retinopathy.  As far she is aware she does not have a history of nephropathy or neuropathy.  She sees Dr. Buddy Duty an endocrinologist for close blood glucose control.  Her most recent hemoglobin A1c was 9% approximately 6 months ago.  The patient had a history of a gastric sleeve in 2020 performed laparoscopically with Dr. Josie Saunders.  This was an uncomplicated procedure and resulted in a 40 pound weight loss.  Her additional surgical history is remarkable for 2 prior cesarean sections.  Her medical history is also significant for fibromyalgia, hypothyroidism, obstructive sleep apnea for which she uses a CPAP device, and depression and anxiety.  She is obese with a BMI of 39 kg per metered squared.  Her family history is remarkable for a mother with ovarian cancer in her early 53s which was stage IV she required chemotherapy and developed myelodysplasia.  The patient lives in [place] and is a Probation officer of historical fiction novels.  She lives with her daughter who is of good health.  Interval Hx:  Preoperative CT scan was ordered and showed findings suggestive of stage IV cancer (with peritoneal nodularity, omental nodularity).   On 01/14/20 she underwent robotic assisted total hysterectomy, BSO, SLN biopsy, omental biopsy, peritoneal biopsy. Intraoperative findings were significant for no gross extrauterine disease, no peritoneal nodularity, no ascites, no enlarged lymph nodes, normal-appearing omentum. Surgery  was uncomplicated, She stayed overnight as a planned admission/observation for poorly controlled diabetes.  Final pathology revealed a FIGO grade2 endometrioid tumor measuring 2.5cm and involving the inner half of the  myometrium (8mm of 54mm thickness). The ovaries, cervix, omentum, peritoneal biopsies and all lymph nodes were negative.  Her pathology was reviewed at multidisciplinary tumor conference as were her preoperative CT scans.  It was determined that these were false positive findings after reviewed by a second radiologist.  She was determined to have high intermediate risk factors in her uterine specimen and therefore adjuvant vaginal brachytherapy was recommended in accordance with NCCN guidelines.  Since surgery she has done well with the only complaint of a vaginal yeast infection.   Current Meds:  Outpatient Encounter Medications as of 02/04/2020  Medication Sig  . DULoxetine (CYMBALTA) 60 MG capsule Take 60 mg by mouth daily.  Marland Kitchen FIASP FLEXTOUCH 100 UNIT/ML SOPN Inject 5-40 Units into the skin in the morning, at noon, and at bedtime.   . insulin degludec (TRESIBA FLEXTOUCH) 100 UNIT/ML SOPN FlexTouch Pen Inject 0.4 mLs (40 Units total) into the skin daily. (Patient taking differently: Inject 48 Units into the skin in the morning. )  . losartan-hydrochlorothiazide (HYZAAR) 50-12.5 MG tablet Take 1 tablet by mouth daily.  . meloxicam (MOBIC) 7.5 MG tablet Take 7.5 mg by mouth daily.  . Multiple Vitamin (MULTIVITAMIN WITH MINERALS) TABS tablet Take 2 tablets by mouth daily.  . pantoprazole (PROTONIX) 40 MG tablet Take 1 tablet (40 mg total) by mouth daily.  . pravastatin (PRAVACHOL) 20 MG tablet Take 20 mg by mouth daily.  Marland Kitchen SYNTHROID 150 MCG tablet Take 150 mcg by mouth daily before breakfast.   . fluconazole (DIFLUCAN) 100 MG tablet Take 1.5 tablets (150 mg total) by mouth daily.  . [DISCONTINUED] ibuprofen (ADVIL) 600 MG tablet Take 1 tablet (600 mg total) by mouth every 6 (six) hours as needed for moderate pain. Do not take with mobic. For AFTER surgery only (Patient not taking: Reported on 01/29/2020)  . [DISCONTINUED] losartan (COZAAR) 25 MG tablet Take 1 tablet (25 mg total) by mouth daily.  (Patient not taking: Reported on 01/29/2020)  . [DISCONTINUED] senna-docusate (SENOKOT-S) 8.6-50 MG tablet Take 2 tablets by mouth at bedtime. For AFTER surgery, do not take if having diarrhea (Patient not taking: Reported on 01/29/2020)  . [DISCONTINUED] traMADol (ULTRAM) 50 MG tablet Take 1 tablet (50 mg total) by mouth every 6 (six) hours as needed for severe pain. For AFTER surgery, do not take and drive (Patient not taking: Reported on 01/29/2020)   No facility-administered encounter medications on file as of 02/04/2020.    Allergy: No Known Allergies  Social Hx:   Social History   Socioeconomic History  . Marital status: Single    Spouse name: Not on file  . Number of children: Not on file  . Years of education: Not on file  . Highest education level: Not on file  Occupational History  . Not on file  Tobacco Use  . Smoking status: Never Smoker  . Smokeless tobacco: Never Used  Substance and Sexual Activity  . Alcohol use: Not Currently    Comment: occasionally  . Drug use: Never  . Sexual activity: Not Currently  Other Topics Concern  . Not on file  Social History Narrative  . Not on file   Social Determinants of Health   Financial Resource Strain:   . Difficulty of Paying Living Expenses:   Food Insecurity:   . Worried  About Running Out of Food in the Last Year:   . [place] in the Last Year:   Transportation Needs:   . Lack of Transportation (Medical):   Marland Kitchen Lack of Transportation (Non-Medical):   Physical Activity:   . Days of Exercise per Week:   . Minutes of Exercise per Session:   Stress:   . Feeling of Stress :   Social Connections:   . Frequency of Communication with Friends and Family:   . Frequency of Social Gatherings with Friends and Family:   . Attends Religious Services:   . Active Member of Clubs or Organizations:   . Attends Archivist Meetings:   Marland Kitchen Marital Status:   Intimate Partner Violence:   . Fear of Current or Ex-Partner:    . Emotionally Abused:   Marland Kitchen Physically Abused:   . Sexually Abused:     Past Surgical Hx:  Past Surgical History:  Procedure Laterality Date  . CESAREAN SECTION     X2  . DILATION AND CURETTAGE OF UTERUS     x 2  . LAPAROSCOPIC GASTRIC SLEEVE RESECTION N/A 06/25/2019   Procedure: LAPAROSCOPIC GASTRIC SLEEVE RESECTION WITH HIATAL HERNIA REPAIR AND UPPER ENDOSCOPY, ERAS Pathway;  Surgeon: Clovis Riley, MD;  Location: [facility];  Service: General;  Laterality: N/A;  . ROBOTIC ASSISTED TOTAL HYSTERECTOMY WITH BILATERAL SALPINGO OOPHERECTOMY Bilateral 01/14/2020   Procedure: XI ROBOTIC ASSISTED TOTAL HYSTERECTOMY WITH BILATERAL SALPINGO OOPHORECTOMY;  Surgeon: Everitt Amber, MD;  Location: [facility];  Service: Gynecology;  Laterality: Bilateral;  . SENTINEL NODE BIOPSY N/A 01/14/2020   Procedure: SENTINEL NODE BIOPSY; OMENTUM BIOPSY;  Surgeon: Everitt Amber, MD;  Location: [facility];  Service: Gynecology;  Laterality: N/A;  . TONSILLECTOMY      Past Medical Hx:  Past Medical History:  Diagnosis Date  . Anxiety   . Arthritis   . Complication of anesthesia    tends to aspirate  . Depression   . Diabetes mellitus without complication ([place])    type 1  dexcom continous glucose meter  . endometrial/uterine ca dx'd 12/2019  . Fibromyalgia    neuropathy eyes  . Headache    history of migraines  . Hypertension   . Hypothyroidism   . PONV (postoperative nausea and vomiting)   . Sleep apnea    cpap    Past Gynecological History:  Menopause at age 74, 2 prior cesarean sections. No LMP recorded. Patient is postmenopausal.  Family Hx:  Family History  Problem Relation Age of Onset  . Cancer Other   . Diabetes Other   . Cataracts Mother   . Ovarian cancer Mother   . Cataracts Father     Review of Systems:  Constitutional  Feels well,    ENT Normal appearing ears and nares bilaterally Skin/Breast  No rash, sores, jaundice, itching, dryness Cardiovascular  No chest pain, shortness of  breath, or edema  Pulmonary  No cough or wheeze.  Gastro Intestinal  No nausea, vomitting, or diarrhoea. No bright red blood per rectum, no abdominal pain, change in bowel movement, or constipation.  Genito Urinary  No frequency, urgency, dysuria, + postmenopausal bleeding Musculo Skeletal  No myalgia, arthralgia, joint swelling or pain  Neurologic  No weakness, numbness, change in gait,  Psychology  No depression, anxiety, insomnia.   Vitals:  Blood pressure (!) 168/85, pulse 64, temperature 98.7 F (37.1 C), temperature source Temporal, resp. rate 17, height 5\' 5"  (1.651 m), weight 230 lb 6.4 oz (104.5  kg), SpO2 99 %.  Physical Exam: WD in NAD Neck  Supple NROM, without any enlargements.  Lymph Node Survey No cervical supraclavicular or inguinal adenopathy Cardiovascular  Pulse normal rate, regularity and rhythm. S1 and S2 normal.  Lungs  Clear to auscultation bilateraly, without wheezes/crackles/rhonchi. Good air movement.  Skin  No rash/lesions/breakdown  Psychiatry  Alert and oriented to person, place, and time  Abdomen  Normoactive bowel sounds, abdomen soft, non-tender and obese without evidence of hernia. Well healed incisions Back No CVA tenderness Genito Urinary  Vulva/vagina: Normal external female genitalia.  No lesions. No discharge or bleeding.  Bladder/urethra:  No lesions or masses, well supported bladder  Vagina: reddish discoloration of vaginal walls with discharge consistent with yeast.   Adnexa: no palpable masses. Rectal  deferred Extremities  No bilateral cyanosis, clubbing or edema.  30 minutes of direct face to face counseling time was spent with the patient. This included discussion about prognosis, therapy recommendations and postoperative side effects and are beyond the scope of routine postoperative care.   Thereasa Solo, MD  02/04/2020, 4:14 PM

## 2020-02-12 ENCOUNTER — Telehealth: Payer: Self-pay | Admitting: General Practice

## 2020-02-12 NOTE — Telephone Encounter (Signed)
[facility] CSW Progress Notes  Second call to patient to review distress screen, no answer.  Left VM w my contact information and brief description of [facility] services. Encouraged her to call back if she desired.  Closing referral at this time.  Edwyna Shell, LCSW Clinical Social Worker Phone:  [phone] Cell:  [phone]

## 2020-02-17 DIAGNOSIS — Z9884 Bariatric surgery status: Secondary | ICD-10-CM | POA: Diagnosis not present

## 2020-02-17 DIAGNOSIS — E109 Type 1 diabetes mellitus without complications: Secondary | ICD-10-CM | POA: Diagnosis not present

## 2020-02-17 DIAGNOSIS — R519 Headache, unspecified: Secondary | ICD-10-CM | POA: Diagnosis not present

## 2020-02-17 DIAGNOSIS — E039 Hypothyroidism, unspecified: Secondary | ICD-10-CM | POA: Diagnosis not present

## 2020-02-17 DIAGNOSIS — Z794 Long term (current) use of insulin: Secondary | ICD-10-CM | POA: Diagnosis not present

## 2020-02-21 ENCOUNTER — Telehealth: Payer: Self-pay | Admitting: *Deleted

## 2020-02-21 NOTE — Telephone Encounter (Signed)
CALLED PATIENT TO REMIND OF [facility] [place] FOR 02-25-20, SPOKE WITH AND SHE IS AWARE OF THESE APPTS.

## 2020-02-24 ENCOUNTER — Ambulatory Visit: Payer: Medicare HMO | Admitting: Radiation Oncology

## 2020-02-24 NOTE — Progress Notes (Signed)
Radiation Oncology         ([phone]) [phone] ________________________________  Vaginal Brachytherapy Procedure Note  Name: Christine Mcknight MRN: [id number]  Date: 02/25/2020  DOB: June 03, 1946    ICD-10-CM   1. Endometrial cancer ([facility])  C54.1     Diagnosis: FIGO stage IA (pT1a, pN0) endometrioid endometrial cancer, grade 2  Narrative: The patient returns today for vaginal cylinder fitting. She was seen in consultation on 01/29/2020. At that time, given the patient's intermediate/high risk for recurrence, vaginal brachytherapy was recommended.  Since consultation, she was seen by Dr. Denman George on 02/04/2020, during which time she was cleared to commence vaginal brachytherapy at six weeks postop.   On review of systems, the patient reports feeling well. The patient denies vaginal bleeding or discharge.  She denies any pelvic pain abdominal pain or bloating.  Allergies:  has No Known Allergies.  Meds: Current Outpatient Medications  Medication Sig Dispense Refill   DULoxetine (CYMBALTA) 60 MG capsule Take 60 mg by mouth daily.     FIASP FLEXTOUCH 100 UNIT/ML SOPN Inject 5-40 Units into the skin in the morning, at noon, and at bedtime.      fluconazole (DIFLUCAN) 100 MG tablet Take 1.5 tablets (150 mg total) by mouth daily. 1.5 tablet 1   insulin degludec (TRESIBA FLEXTOUCH) 100 UNIT/ML SOPN FlexTouch Pen Inject 0.4 mLs (40 Units total) into the skin daily. (Patient taking differently: Inject 48 Units into the skin in the morning. )     losartan-hydrochlorothiazide (HYZAAR) 50-12.5 MG tablet Take 1 tablet by mouth daily.     meloxicam (MOBIC) 7.5 MG tablet Take 7.5 mg by mouth daily.     Multiple Vitamin (MULTIVITAMIN WITH MINERALS) TABS tablet Take 2 tablets by mouth daily.     pantoprazole (PROTONIX) 40 MG tablet Take 1 tablet (40 mg total) by mouth daily. 90 tablet 4   pravastatin (PRAVACHOL) 20 MG tablet Take 20 mg by mouth daily.     SYNTHROID 150 MCG tablet Take 150 mcg by mouth  daily before breakfast.      No current facility-administered medications for this encounter.    Physical Findings:  The patient is in no acute distress. Patient is alert and oriented.  height is 5\' 5"  (1.651 m) and weight is 225 lb 2 oz (102.1 kg). Her temporal temperature is 97.3 F (36.3 C) (abnormal). Her blood pressure is 160/82 (abnormal) and her pulse is 62. Her respiration is 18 and oxygen saturation is 98%.  . Lungs are clear to auscultation bilaterally. Heart has regular rate and rhythm. No palpable cervical, supraclavicular, or axillary adenopathy. Abdomen soft, non-tender, normal bowel sounds. On pelvic examination the external genitalia were unremarkable. A speculum exam was performed. There are no mucosal lesions noted in the vaginal vault. On bimanual examination there were no pelvic masses appreciated.  Vaginal cuff intact.  White vaginal discharge consistent with yeast infection.  Lab Findings: Lab Results  Component Value Date   WBC 9.2 01/15/2020   HGB 10.1 (L) 01/15/2020   HCT 30.7 (L) 01/15/2020   MCV 93.9 01/15/2020   PLT 248 01/15/2020    Radiographic Findings: No results found.  Impression: FIGO stage IA (pT1a, pN0) endometrioid endometrial cancer, grade 2  Patient is now ready to proceed with high-dose-rate treatments.  Patient was successfully fitted for a vaginal cylinder.  She will be treated with a 2.5 cm segmented cylinder with a treatment length of 3 cm.  Plan: The patient will proceed with CT simulation and vaginal brachytherapy today.  She is scheduled to receive 5 high-dose-rate treatments.  Patient does have a Diflucan prescription left over from earlier this year that she never used and I  recommended she use this medication.  -----------------------------------  Blair Promise, PhD, MD  This document serves as a record of services personally performed by Gery Pray, MD. It was created on his behalf by Clerance Lav, a trained medical scribe.  The creation of this record is based on the scribe's personal observations and the provider's statements to them. This document has been checked and approved by the attending provider.

## 2020-02-25 ENCOUNTER — Other Ambulatory Visit: Payer: Self-pay

## 2020-02-25 ENCOUNTER — Ambulatory Visit
Admission: RE | Admit: 2020-02-25 | Discharge: 2020-02-25 | Disposition: A | Discharge status: Home / Self Care | Payer: Medicare HMO | Source: Ambulatory Visit | Attending: Radiation Oncology | Admitting: Radiation Oncology

## 2020-02-25 ENCOUNTER — Encounter: Payer: Self-pay | Admitting: Radiation Oncology

## 2020-02-25 VITALS — BP 160/82 | HR 62 | Temp 97.3°F | Resp 18 | Ht 65.0 in | Wt 225.1 lb

## 2020-02-25 DIAGNOSIS — C541 Malignant neoplasm of endometrium: Secondary | ICD-10-CM | POA: Diagnosis not present

## 2020-02-25 NOTE — Progress Notes (Signed)
°  Radiation Oncology         [phone] ________________________________  Name: Christine Mcknight MRN: [id number]  Date: 02/25/2020  DOB: 1946-02-17  CC: Jonathon Jordan, MD  Everitt Amber, MD  HDR BRACHYTHERAPY NOTE  DIAGNOSIS: FIGO stage IA (pT1a, pN0) endometrioid endometrial cancer, grade 2   Simple treatment device note: Patient had construction of her custom vaginal cylinder. She will be treated with a 2.5 cm diameter segmented cylinder. This conforms to her anatomy without undue discomfort.  Vaginal brachytherapy procedure node: The patient was brought to the [place] suite. Identity was confirmed. All relevant records and images related to the planned course of therapy were reviewed. The patient freely provided informed written consent to proceed with treatment after reviewing the details related to the planned course of therapy. The consent form was witnessed and verified by the simulation staff. Then, the patient was set-up in a stable reproducible supine position for radiation therapy. Pelvic exam revealed the vaginal cuff to be intact . The patient's custom vaginal cylinder was placed in the proximal vagina. This was affixed to the CT/MR stabilization plate to prevent slippage. Patient tolerated the placement well.  Verification simulation note:  A fiducial marker was placed within the vaginal cylinder. An AP and lateral film was then obtained through the pelvis area. This documented accurate position of the vaginal cylinder for treatment.  HDR BRACHYTHERAPY TREATMENT  The remote afterloading device was affixed to the vaginal cylinder by catheter. Patient then proceeded to undergo her first high-dose-rate treatment directed at the proximal vagina. The patient was prescribed a dose of 6.0 gray to be delivered to the mucosal surface. Treatment length was 3.0 cm. Patient was treated with 1 channel using 7 dwell positions. Treatment time was 226.7 seconds. Iridium 192 was the high-dose-rate source  for treatment. The patient tolerated the treatment well. After completion of her therapy, a radiation survey was performed documenting return of the iridium source into the [employer] safe.   PLAN: The patient will return next week for her second high-dose-rate treatment. ________________________________    Blair Promise, PhD, MD  This document serves as a record of services personally performed by Gery Pray, MD. It was created on his behalf by Clerance Lav, a trained medical scribe. The creation of this record is based on the scribe's personal observations and the provider's statements to them. This document has been checked and approved by the attending provider.

## 2020-02-25 NOTE — Addendum Note (Signed)
Encounter addended by: Gery Pray, MD on: 02/25/2020 6:55 PM  Actions taken: Level of Service modified, Follow-up modified

## 2020-02-25 NOTE — Progress Notes (Signed)
Patient here as a new HDR [place]. Aalert and oriented and reports having a h/a but no other sx of pain.  BP (!) 160/82 (BP Location: Left Arm, Patient Position: Sitting)   Pulse 62   Temp (!) 97.3 F (36.3 C) (Temporal)   Resp 18   Ht 5\' 5"  (1.651 m)   Wt 225 lb 2 oz (102.1 kg)   SpO2 98%   BMI 37.46 kg/m    Wt Readings from Last 3 Encounters:  02/25/20 225 lb 2 oz (102.1 kg)  02/04/20 230 lb 6.4 oz (104.5 kg)  01/29/20 232 lb 6.4 oz (105.4 kg)

## 2020-02-25 NOTE — Progress Notes (Signed)
°  Radiation Oncology         [phone]) [phone] ________________________________  Name: Christine Mcknight MRN: [id number]  Date: 02/25/2020  DOB: [birth year]  SIMULATION AND TREATMENT PLANNING NOTE HDR BRACHYTHERAPY  DIAGNOSIS: FIGO stage IA (pT1a, pN0) endometrioid endometrial cancer, grade 2  NARRATIVE:  The patient was brought to the [facility] suite.  Identity was confirmed.  All relevant records and images related to the planned course of therapy were reviewed.  The patient freely provided informed written consent to proceed with treatment after reviewing the details related to the planned course of therapy. The consent form was witnessed and verified by the simulation staff.  Then, the patient was set-up in a stable reproducible  supine position for radiation therapy.  CT images were obtained.  Surface markings were placed.  The CT images were loaded into the planning software.  Then the target and avoidance structures were contoured.  Treatment planning then occurred.  The radiation prescription was entered and confirmed.   I have requested : Brachytherapy Isodose Plan and Dosimetry Calculations to plan the radiation distribution.    PLAN:  The patient will receive 30 Gy in five high-dose-rate fractions directed at the vaginal cuff. Iridium 192 will be the high-dose-rate source.  The patient will be treated with a 2.5 cm diameter segmented cylinder with a treatment length of 3 cm. ________________________________   Blair Promise, PhD, MD  This document serves as a record of services personally performed by Gery Pray, MD. It was created on his behalf by Clerance Lav, a trained medical scribe. The creation of this record is based on the scribe's personal observations and the provider's statements to them. This document has been checked and approved by the attending provider.

## 2020-03-02 ENCOUNTER — Telehealth: Payer: Self-pay | Admitting: *Deleted

## 2020-03-02 ENCOUNTER — Ambulatory Visit: Payer: Medicare HMO | Admitting: Radiation Oncology

## 2020-03-02 NOTE — Telephone Encounter (Signed)
Called patient to remind of [place]. for 03-03-20, spoke with patient and she is aware of this tx.

## 2020-03-02 NOTE — Progress Notes (Signed)
°  Radiation Oncology         [phone]) [phone] ________________________________  Name: Carlen Rebuck MRN: [id number] Date: 03/03/2020  DOB: 21-Aug-1946  CC: Jonathon Jordan, MD  Everitt Amber, MD  HDR BRACHYTHERAPY NOTE  DIAGNOSIS: FIGO stage IA (pT1a, pN0) endometrioid endometrial cancer, grade 2   Simple treatment device note: Patient had construction of her custom vaginal cylinder. She will be treated with a 2.5 cm diameter segmented cylinder. This conforms to her anatomy without undue discomfort.  Vaginal brachytherapy procedure node: The patient was brought to the [place] suite. Identity was confirmed. All relevant records and images related to the planned course of therapy were reviewed. The patient freely provided informed written consent to proceed with treatment after reviewing the details related to the planned course of therapy. The consent form was witnessed and verified by the simulation staff. Then, the patient was set-up in a stable reproducible supine position for radiation therapy. Pelvic exam revealed the vaginal cuff to be intact . The patient's custom vaginal cylinder was placed in the proximal vagina. This was affixed to the CT/MR stabilization plate to prevent slippage. Patient tolerated the placement well.  Verification simulation note:  A fiducial marker was placed within the vaginal cylinder. An AP and lateral film was then obtained through the pelvis area. This documented accurate position of the vaginal cylinder for treatment.  HDR BRACHYTHERAPY TREATMENT  The remote afterloading device was affixed to the vaginal cylinder by catheter. Patient then proceeded to undergo her second high-dose-rate treatment directed at the proximal vagina. The patient was prescribed a dose of 6.0 gray to be delivered to the mucosal surface. Treatment length was 3.0 cm. Patient was treated with 1 channel using 7 dwell positions. Treatment time was 241.9 seconds. Iridium 192 was the high-dose-rate  source for treatment. The patient tolerated the treatment well. After completion of her therapy, a radiation survey was performed documenting return of the iridium source into the GammaMed safe.   PLAN: The patient will return next week for her third high-dose-rate treatment. ________________________________    Blair Promise, PhD, MD  This document serves as a record of services personally performed by Gery Pray, MD. It was created on his behalf by Clerance Lav, a trained medical scribe. The creation of this record is based on the scribe's personal observations and the provider's statements to them. This document has been checked and approved by the attending provider.

## 2020-03-03 ENCOUNTER — Ambulatory Visit
Admission: RE | Admit: 2020-03-03 | Discharge: 2020-03-03 | Disposition: A | Discharge status: Home / Self Care | Payer: Medicare HMO | Source: Ambulatory Visit | Attending: Radiation Oncology | Admitting: Radiation Oncology

## 2020-03-03 DIAGNOSIS — C541 Malignant neoplasm of endometrium: Secondary | ICD-10-CM

## 2020-03-04 ENCOUNTER — Telehealth: Payer: Self-pay | Admitting: *Deleted

## 2020-03-04 NOTE — Telephone Encounter (Signed)
Called patient to remind of HDR Tx. for 03-05-20 @ 8 am, spoke with patient and she is aware of this tx.

## 2020-03-04 NOTE — Progress Notes (Signed)
  Radiation Oncology         (336) [phone] ________________________________  Name: Bayle Calvo MRN: [id number]  Date: 03/05/2020  DOB: 09-Jul-1946  CC: Jonathon Jordan, MD  Everitt Amber, MD  HDR BRACHYTHERAPY NOTE  DIAGNOSIS: FIGO stage IA (pT1a, pN0) endometrioid endometrial cancer, grade 2   Simple treatment device note: Patient had construction of her custom vaginal cylinder. She will be treated with a 2.5 cm diameter segmented cylinder. This conforms to her anatomy without undue discomfort.  Vaginal brachytherapy procedure node: The patient was brought to the [place] suite. Identity was confirmed. All relevant records and images related to the planned course of therapy were reviewed. The patient freely provided informed written consent to proceed with treatment after reviewing the details related to the planned course of therapy. The consent form was witnessed and verified by the simulation staff. Then, the patient was set-up in a stable reproducible supine position for radiation therapy. Pelvic exam revealed the vaginal cuff to be intact . The patient's custom vaginal cylinder was placed in the proximal vagina. This was affixed to the CT/MR stabilization plate to prevent slippage. Patient tolerated the placement well.  Verification simulation note:  A fiducial marker was placed within the vaginal cylinder. An AP and lateral film was then obtained through the pelvis area. This documented accurate position of the vaginal cylinder for treatment.  HDR BRACHYTHERAPY TREATMENT  The remote afterloading device was affixed to the vaginal cylinder by catheter. Patient then proceeded to undergo her third high-dose-rate treatment directed at the proximal vagina. The patient was prescribed a dose of 6.0 gray to be delivered to the mucosal surface. Treatment length was 3.0 cm. Patient was treated with 1 channel using 7 dwell positions. Treatment time was 246.7 seconds. Iridium 192 was the high-dose-rate  source for treatment. The patient tolerated the treatment well. After completion of her therapy, a radiation survey was performed documenting return of the iridium source into the GammaMed safe.   PLAN: The patient will return next week for her fourth high-dose-rate treatment. ________________________________    Blair Promise, PhD, MD  This document serves as a record of services personally performed by Gery Pray, MD. It was created on his behalf by Clerance Lav, a trained medical scribe. The creation of this record is based on the scribe's personal observations and the provider's statements to them. This document has been checked and approved by the attending provider.

## 2020-03-05 ENCOUNTER — Ambulatory Visit
Admission: RE | Admit: 2020-03-05 | Discharge: 2020-03-05 | Disposition: A | Discharge status: Home / Self Care | Payer: Medicare HMO | Source: Ambulatory Visit | Attending: Radiation Oncology | Admitting: Radiation Oncology

## 2020-03-05 ENCOUNTER — Other Ambulatory Visit: Payer: Self-pay

## 2020-03-05 DIAGNOSIS — C541 Malignant neoplasm of endometrium: Secondary | ICD-10-CM | POA: Diagnosis not present

## 2020-03-06 ENCOUNTER — Telehealth: Payer: Self-pay | Admitting: *Deleted

## 2020-03-06 NOTE — Telephone Encounter (Signed)
CALLED PATIENT TO INFORM OF HDR [place] 03-09-20 @ 9 AM, LVM FOR A RETURN CALL

## 2020-03-09 ENCOUNTER — Other Ambulatory Visit: Payer: Self-pay

## 2020-03-09 ENCOUNTER — Ambulatory Visit
Admission: RE | Admit: 2020-03-09 | Discharge: 2020-03-09 | Disposition: A | Discharge status: Home / Self Care | Payer: Medicare HMO | Source: Ambulatory Visit | Attending: Radiation Oncology | Admitting: Radiation Oncology

## 2020-03-09 DIAGNOSIS — C541 Malignant neoplasm of endometrium: Secondary | ICD-10-CM

## 2020-03-09 NOTE — Progress Notes (Signed)
°  Radiation Oncology         [phone] ________________________________  Name: Christine Mcknight MRN: [id number]  Date: 03/09/2020  DOB: 1946-03-15  CC: Jonathon Jordan, MD  Everitt Amber, MD  HDR BRACHYTHERAPY NOTE  DIAGNOSIS: FIGO stage IA (pT1a, pN0) endometrioid endometrial cancer, grade 2   Simple treatment device note: Patient had construction of her custom vaginal cylinder. She will be treated with a 2.5 cm diameter segmented cylinder. This conforms to her anatomy without undue discomfort.  Vaginal brachytherapy procedure node: The patient was brought to the [place] suite. Identity was confirmed. All relevant records and images related to the planned course of therapy were reviewed. The patient freely provided informed written consent to proceed with treatment after reviewing the details related to the planned course of therapy. The consent form was witnessed and verified by the simulation staff. Then, the patient was set-up in a stable reproducible supine position for radiation therapy. Pelvic exam revealed the vaginal cuff to be intact . The patient's custom vaginal cylinder was placed in the proximal vagina. This was affixed to the CT/MR stabilization plate to prevent slippage. Patient tolerated the placement well.  Verification simulation note:  A fiducial marker was placed within the vaginal cylinder. An AP and lateral film was then obtained through the pelvis area. This documented accurate position of the vaginal cylinder for treatment.  HDR BRACHYTHERAPY TREATMENT  The remote afterloading device was affixed to the vaginal cylinder by catheter. Patient then proceeded to undergo her fourth high-dose-rate treatment directed at the proximal vagina. The patient was prescribed a dose of 6.0 gray to be delivered to the mucosal surface. Treatment length was 3.0 cm. Patient was treated with 1 channel using 7 dwell positions. Treatment time was 256.2 seconds. Iridium 192 was the high-dose-rate  source for treatment. The patient tolerated the treatment well. After completion of her therapy, a radiation survey was performed documenting return of the iridium source into the GammaMed safe.   PLAN: The patient will return in three days for her fifth and final high-dose-rate treatment. ________________________________    Blair Promise, PhD, MD  This document serves as a record of services personally performed by Gery Pray, MD. It was created on his behalf by Clerance Lav, a trained medical scribe. The creation of this record is based on the scribe's personal observations and the provider's statements to them. This document has been checked and approved by the attending provider.

## 2020-03-11 ENCOUNTER — Telehealth: Payer: Self-pay | Admitting: *Deleted

## 2020-03-11 NOTE — Progress Notes (Signed)
  Radiation Oncology         (336) [phone] ________________________________  Name: Christine Mcknight MRN: [id number]  Date: 03/12/2020  DOB: 1946/03/24  CC: Jonathon Jordan, MD  Everitt Amber, MD  HDR BRACHYTHERAPY NOTE  DIAGNOSIS: FIGO stage IA (pT1a, pN0) endometrioid endometrial cancer, grade 2   Simple treatment device note: Patient had construction of her custom vaginal cylinder. She will be treated with a 2.5 cm diameter segmented cylinder. This conforms to her anatomy without undue discomfort.  Vaginal brachytherapy procedure node: The patient was brought to the [place] suite. Identity was confirmed. All relevant records and images related to the planned course of therapy were reviewed. The patient freely provided informed written consent to proceed with treatment after reviewing the details related to the planned course of therapy. The consent form was witnessed and verified by the simulation staff. Then, the patient was set-up in a stable reproducible supine position for radiation therapy. Pelvic exam revealed the vaginal cuff to be intact . The patient's custom vaginal cylinder was placed in the proximal vagina. This was affixed to the CT/MR stabilization plate to prevent slippage. Patient tolerated the placement well.  Verification simulation note:  A fiducial marker was placed within the vaginal cylinder. An AP and lateral film was then obtained through the pelvis area. This documented accurate position of the vaginal cylinder for treatment.  HDR BRACHYTHERAPY TREATMENT  The remote afterloading device was affixed to the vaginal cylinder by catheter. Patient then proceeded to undergo her fifth high-dose-rate treatment directed at the proximal vagina. The patient was prescribed a dose of 6.0 gray to be delivered to the mucosal surface. Treatment length was 3.0 cm. Patient was treated with 1 channel using 7 dwell positions. Treatment time was 263.9 seconds. Iridium 192 was the high-dose-rate  source for treatment. The patient tolerated the treatment well. After completion of her therapy, a radiation survey was performed documenting return of the iridium source into the GammaMed safe.   PLAN: Routine follow-up with radiation oncology in one month. ________________________________    Blair Promise, PhD, MD  This document serves as a record of services personally performed by Gery Pray, MD. It was created on his behalf by Clerance Lav, a trained medical scribe. The creation of this record is based on the scribe's personal observations and the provider's statements to them. This document has been checked and approved by the attending provider.

## 2020-03-11 NOTE — Telephone Encounter (Signed)
Called patient to remind of HDR Tx. for 03-12-20 @ 2 pm, spoke with patient and she is aware of this tx.

## 2020-03-12 ENCOUNTER — Encounter: Payer: Self-pay | Admitting: Radiation Oncology

## 2020-03-12 ENCOUNTER — Ambulatory Visit
Admission: RE | Admit: 2020-03-12 | Discharge: 2020-03-12 | Disposition: A | Discharge status: Home / Self Care | Payer: Medicare HMO | Source: Ambulatory Visit | Attending: Radiation Oncology | Admitting: Radiation Oncology

## 2020-03-12 DIAGNOSIS — C541 Malignant neoplasm of endometrium: Secondary | ICD-10-CM | POA: Diagnosis not present

## 2020-03-12 DIAGNOSIS — Z923 Personal history of irradiation: Secondary | ICD-10-CM

## 2020-03-12 HISTORY — DX: Personal history of irradiation: Z92.3

## 2020-04-06 NOTE — Progress Notes (Incomplete)
  Patient Name: Christine Mcknight MRN: [id number] DOB: 16-Jan-1946 Referring Physician: Everitt Mcknight (Profile Not Attached) Date of Service: 03/12/2020 [facility] [place]                                                        End Of Treatment Note  Diagnoses: C54.1-Malignant neoplasm of endometrium  Cancer Staging: FIGO stage IA (pT1a, pN0) endometrioid endometrial cancer, grade 2  Intent: Curative  Radiation Treatment Dates: 02/25/2020 through 03/12/2020 Site Technique Total Dose (Gy) Dose per Fx (Gy) Completed Fx Beam Energies  Vagina: Pelvis HDR-brachy 30/30 6 5/5 Ir-192   Narrative: The patient tolerated radiation therapy relatively well. She did not report any adverse radiation-related side effects.  Plan: The patient will follow-up with radiation oncology in one month.  ________________________________________________   Blair Promise, PhD, MD  This document serves as a record of services personally performed by Gery Pray, MD. It was created on his behalf by Clerance Lav, a trained medical scribe. The creation of this record is based on the scribe's personal observations and the provider's statements to them. This document has been checked and approved by the attending provider.

## 2020-04-14 NOTE — Progress Notes (Signed)
Radiation Oncology         (336) [phone] ________________________________  Name: Christine Mcknight MRN: [id number]  Date: 04/16/2020  DOB: 10-25-1945  Follow-Up Visit Note  CC: Jonathon Jordan, MD  Everitt Amber, MD    ICD-10-CM   1. Endometrial cancer (HCC)  C54.1     Diagnosis: FIGO stage IA (pT1a, pN0) endometrioid endometrial cancer, grade 2  Interval Since Last Radiation: One month and five days.  Radiation Treatment Dates: 02/25/2020 through 03/12/2020 Site Technique Total Dose (Gy) Dose per Fx (Gy) Completed Fx Beam Energies  Vagina: Pelvis HDR-brachy 30/30 6 5/5 Ir-192    Narrative:  The patient returns today for routine follow-up. No significant interval history since the end of treatment.  On review of systems, she reports improving fatigue. She denies vaginal bleeding, pain, and changes in bowel or bladder patterns.               ALLERGIES:  has No Known Allergies.  Meds: Current Outpatient Medications  Medication Sig Dispense Refill  . DULoxetine (CYMBALTA) 60 MG capsule Take 60 mg by mouth daily.    Marland Kitchen FIASP FLEXTOUCH 100 UNIT/ML SOPN Inject 5-40 Units into the skin in the morning, at noon, and at bedtime.     . fluconazole (DIFLUCAN) 100 MG tablet Take 1.5 tablets (150 mg total) by mouth daily. 1.5 tablet 1  . insulin degludec (TRESIBA FLEXTOUCH) 100 UNIT/ML SOPN FlexTouch Pen Inject 0.4 mLs (40 Units total) into the skin daily. (Patient taking differently: Inject 48 Units into the skin in the morning. )    . losartan-hydrochlorothiazide (HYZAAR) 50-12.5 MG tablet Take 1 tablet by mouth daily.    . meloxicam (MOBIC) 7.5 MG tablet Take 7.5 mg by mouth daily.    . Multiple Vitamin (MULTIVITAMIN WITH MINERALS) TABS tablet Take 2 tablets by mouth daily.    . pantoprazole (PROTONIX) 40 MG tablet Take 1 tablet (40 mg total) by mouth daily. 90 tablet 4  . pravastatin (PRAVACHOL) 20 MG tablet Take 20 mg by mouth daily.    Marland Kitchen SYNTHROID 150 MCG tablet Take 150 mcg by mouth daily  before breakfast.      No current facility-administered medications for this encounter.    Physical Findings: The patient is in no acute distress. Patient is alert and oriented.  height is 5\' 5"  (1.651 m) and weight is 218 lb 6.4 oz (99.1 kg). Her temperature is 98.5 F (36.9 C). Her blood pressure is 145/74 (abnormal) and her pulse is 68. Her respiration is 20 and oxygen saturation is 97%.  No significant changes. Lungs are clear to auscultation bilaterally. Heart has regular rate and rhythm. No palpable cervical, supraclavicular, or axillary adenopathy. Abdomen soft, non-tender, normal bowel sounds. Pelvic exam deferred in light of recent treatment.  Lab Findings: Lab Results  Component Value Date   WBC 9.2 01/15/2020   HGB 10.1 (L) 01/15/2020   HCT 30.7 (L) 01/15/2020   MCV 93.9 01/15/2020   PLT 248 01/15/2020    Radiographic Findings: No results found.  Impression: FIGO stage IA (pT1a, pN0) endometrioid endometrial cancer, grade 2  The patient tolerated her vaginal brachytherapy quite well.  She denies any residual side effects at this time  Plan: The patient will follow-up with Dr. Denman George in two months and with radiation oncology in five months.  She was given a vaginal dilator and instructions on its use in light of her pelvic radiation therapy (brachytherapy)  Total time spent in this encounter was 15 minutes which included  reviewing the patient's most recent interval history, physical examination, and documentation.  ____________________________________   Blair Promise, PhD, MD  This document serves as a record of services personally performed by Gery Pray, MD. It was created on his behalf by Clerance Lav, a trained medical scribe. The creation of this record is based on the scribe's personal observations and the provider's statements to them. This document has been checked and approved by the attending provider.

## 2020-04-16 ENCOUNTER — Encounter: Payer: Self-pay | Admitting: Radiation Oncology

## 2020-04-16 ENCOUNTER — Ambulatory Visit
Admission: RE | Admit: 2020-04-16 | Discharge: 2020-04-16 | Disposition: A | Discharge status: Home / Self Care | Payer: Medicare HMO | Source: Ambulatory Visit | Attending: Radiation Oncology | Admitting: Radiation Oncology

## 2020-04-16 ENCOUNTER — Other Ambulatory Visit: Payer: Self-pay

## 2020-04-16 ENCOUNTER — Telehealth: Payer: Self-pay | Admitting: *Deleted

## 2020-04-16 VITALS — BP 145/74 | HR 68 | Temp 98.5°F | Resp 20 | Ht 65.0 in | Wt 218.4 lb

## 2020-04-16 DIAGNOSIS — C541 Malignant neoplasm of endometrium: Secondary | ICD-10-CM | POA: Diagnosis not present

## 2020-04-16 DIAGNOSIS — Z791 Long term (current) use of non-steroidal anti-inflammatories (NSAID): Secondary | ICD-10-CM | POA: Insufficient documentation

## 2020-04-16 DIAGNOSIS — Z08 Encounter for follow-up examination after completed treatment for malignant neoplasm: Secondary | ICD-10-CM | POA: Diagnosis not present

## 2020-04-16 DIAGNOSIS — R5383 Other fatigue: Secondary | ICD-10-CM | POA: Diagnosis not present

## 2020-04-16 DIAGNOSIS — Z79899 Other long term (current) drug therapy: Secondary | ICD-10-CM | POA: Insufficient documentation

## 2020-04-16 NOTE — Telephone Encounter (Signed)
Called patient to inform of fu with Dr. Denman George on 06-12-20- arrival time- 2:15 pm, spoke with patient and she is aware of this appt.

## 2020-04-16 NOTE — Progress Notes (Addendum)
Patient is here for a 1 month f/u with Dr. Sondra Come. Patient denies bleeding, pain, probs with bladder or bowels. Has mild fatigue which has improved in the last month.  BP (!) 145/74 (BP Location: Right Arm, Patient Position: Sitting, Cuff Size: Normal)   Pulse 68   Temp 98.5 F (36.9 C)   Resp 20   Ht 5\' 5"  (1.651 m)   Wt 218 lb 6.4 oz (99.1 kg)   SpO2 97%   BMI 36.34 kg/m   Wt Readings from Last 3 Encounters:  04/16/20 218 lb 6.4 oz (99.1 kg)  02/25/20 225 lb 2 oz (102.1 kg)  02/04/20 230 lb 6.4 oz (104.5 kg)    Home Care Instructions for the Insertion and Care of Your Vaginal Dilator  Why Do I Need a Vaginal Dilator?  Internal radiation therapy may cause scar tissue to form at the top of your vagina (vaginal cuff).  This may make vaginal examinations difficult in the future. You can prevent scar tissue from forming by using a vaginal dilator (a smooth plastic rod), and/or by having regular sexual intercourse.  If not using the dilator you should be having intercourse two or three times a week.  If you are unable to have intercourse, you should use your vaginal dilator.  You may have some spotting or bleeding from your dilator or intercourse the first few times. You may also have some discomfort. If discomfort occurs with intercourse, you and your partner may need to stop for a while and try again later.  How to Use Your Vaginal Dilator  - Wash the dilator with soap and water before and after each use. - Check the dilator to be sure it is smooth. Do not use the dilator if you find any roughspots. - Coat the dilator with K-Y Jelly, Astroglide, or Replens. Do not use Vaseline, baby oil, or other oil based lubricants. They are not water-soluble and can be irritating to the tissues in the vagina. - Lie on your back with your knees bent and legs apart. - Insert the rounded end of the dilator into your vagina as far as it will go without causing pain or discomfort. - Close your  knees and slowly straighten your legs. - Keep the dilator in your vagina for about 10 to 15 minutes.  Please use 3 times a week, for example: Monday, Wednesday and Friday evenings. [facility] your knees, open your legs, and gently remove the dilator. - Gently cleanse the skin around the vaginal opening. - Wash the dilator after each use. -  It is important that you use the dilator routinely until instructed otherwise by your doctor.  Patient given an xs plus and a small dilator to use with the above instructions. Above information reviewed with patient and shee verbalized understanding.

## 2020-06-04 DIAGNOSIS — E039 Hypothyroidism, unspecified: Secondary | ICD-10-CM | POA: Diagnosis not present

## 2020-06-04 DIAGNOSIS — Z794 Long term (current) use of insulin: Secondary | ICD-10-CM | POA: Diagnosis not present

## 2020-06-04 DIAGNOSIS — E1065 Type 1 diabetes mellitus with hyperglycemia: Secondary | ICD-10-CM | POA: Diagnosis not present

## 2020-06-04 DIAGNOSIS — Z9884 Bariatric surgery status: Secondary | ICD-10-CM | POA: Diagnosis not present

## 2020-06-08 ENCOUNTER — Telehealth: Payer: Self-pay | Admitting: *Deleted

## 2020-06-08 NOTE — Telephone Encounter (Signed)
Patient called and left a message saying "I see Dr Denman George on Friday 9/17 @2 :30 pm. I need to move my appt to the morning that day." Called the patient and left a message saying "Dr Denman George sees new patient's in the morning, so we will have to ask her tomorrow after her surgery cases.

## 2020-06-09 NOTE — Telephone Encounter (Signed)
OK per Dr Denman George to move patient to 12 pm on 9/17

## 2020-06-12 ENCOUNTER — Inpatient Hospital Stay: Payer: Medicare HMO | Attending: Gynecologic Oncology | Admitting: Gynecologic Oncology

## 2020-06-12 ENCOUNTER — Other Ambulatory Visit: Payer: Self-pay

## 2020-06-12 ENCOUNTER — Encounter: Payer: Self-pay | Admitting: Gynecologic Oncology

## 2020-06-12 VITALS — BP 142/62 | HR 62 | Temp 97.8°F | Resp 18 | Wt 217.4 lb

## 2020-06-12 DIAGNOSIS — F329 Major depressive disorder, single episode, unspecified: Secondary | ICD-10-CM | POA: Insufficient documentation

## 2020-06-12 DIAGNOSIS — Z8041 Family history of malignant neoplasm of ovary: Secondary | ICD-10-CM | POA: Diagnosis not present

## 2020-06-12 DIAGNOSIS — E039 Hypothyroidism, unspecified: Secondary | ICD-10-CM | POA: Insufficient documentation

## 2020-06-12 DIAGNOSIS — Z809 Family history of malignant neoplasm, unspecified: Secondary | ICD-10-CM | POA: Diagnosis not present

## 2020-06-12 DIAGNOSIS — E119 Type 2 diabetes mellitus without complications: Secondary | ICD-10-CM | POA: Diagnosis not present

## 2020-06-12 DIAGNOSIS — I10 Essential (primary) hypertension: Secondary | ICD-10-CM | POA: Diagnosis not present

## 2020-06-12 DIAGNOSIS — Z79899 Other long term (current) drug therapy: Secondary | ICD-10-CM | POA: Diagnosis not present

## 2020-06-12 DIAGNOSIS — G4730 Sleep apnea, unspecified: Secondary | ICD-10-CM | POA: Insufficient documentation

## 2020-06-12 DIAGNOSIS — F419 Anxiety disorder, unspecified: Secondary | ICD-10-CM | POA: Diagnosis not present

## 2020-06-12 DIAGNOSIS — Z90710 Acquired absence of both cervix and uterus: Secondary | ICD-10-CM | POA: Insufficient documentation

## 2020-06-12 DIAGNOSIS — Z833 Family history of diabetes mellitus: Secondary | ICD-10-CM | POA: Insufficient documentation

## 2020-06-12 DIAGNOSIS — Z90722 Acquired absence of ovaries, bilateral: Secondary | ICD-10-CM | POA: Insufficient documentation

## 2020-06-12 DIAGNOSIS — Z791 Long term (current) use of non-steroidal anti-inflammatories (NSAID): Secondary | ICD-10-CM | POA: Diagnosis not present

## 2020-06-12 DIAGNOSIS — Z794 Long term (current) use of insulin: Secondary | ICD-10-CM | POA: Diagnosis not present

## 2020-06-12 DIAGNOSIS — C541 Malignant neoplasm of endometrium: Secondary | ICD-10-CM | POA: Diagnosis not present

## 2020-06-12 DIAGNOSIS — Z923 Personal history of irradiation: Secondary | ICD-10-CM | POA: Insufficient documentation

## 2020-06-12 DIAGNOSIS — R69 Illness, unspecified: Secondary | ICD-10-CM | POA: Diagnosis not present

## 2020-06-12 DIAGNOSIS — Z9079 Acquired absence of other genital organ(s): Secondary | ICD-10-CM | POA: Diagnosis not present

## 2020-06-12 NOTE — Patient Instructions (Signed)
Please notify Dr Denman George at phone number [phone] if you notice vaginal bleeding, new pelvic or abdominal pains, bloating, feeling full easy, or a change in bladder or bowel function.   Please have Dr Clabe Seal office contact Dr Serita Grit office (at [phone]) in December after your appointment with him to request an appointment with Dr Denman George for March, 2022.  Dr Serita Grit office has scheduled you to see the genetics specialists to discuss if further testing is of benefit.

## 2020-06-12 NOTE — Progress Notes (Signed)
Gynecologic Oncology Follow-up Note  Chief Complaint:  Chief Complaint  Patient presents with   endometrial cancer   Assessment/Plan:  Ms. Danijela Vessey  is a 74 y.o.  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 S/p robotic staging on 01/14/20. High/intermediate risk factors for recurrence. S/p vaginal brachytherapy completed June, 2021 (for high intermediate risk factors).  No evidence for recurrence.  I recommend she follow-up at 3 monthly intervals for symptom review, physical examination and pelvic examination. Pap smear is not recommended in routine endometrial cancer surveillance. After 2 years we will space these visits to every 6 months, and then annually if recurrence has not developed within 5 years.  She is interested in meeting with genetics to discuss the potential for testing given her mother's history of ovarian cancer. We will faciilitate this referral.   HPI: Ms Faith Patricelli is a 74 year old P2 who was seen in consultation at the request of Dr Nelda Marseille for evaluation of endometrial cancer (FIGO grade 3).  The patient reported a single episode of postmenopausal bleeding in the middle of the night on approximately March 26th, 2021.  She then saw her primary care physician the following day who scheduled her to see Dr.  Nelda Marseille within the week for an evaluation including ultrasound and endometrial biopsy.  When Dr. Nelda Marseille evaluated the patient she proceeded with an endometrial [place] biopsy due to concern for occult malignancy.  This was performed on December 26, 2019.  This biopsy returned as FIGO grade 3 endometrioid adenocarcinoma.  Preoperative CT scan was ordered and showed findings suggestive of stage IV cancer (with peritoneal nodularity, omental nodularity).   On 01/14/20 she underwent robotic assisted total hysterectomy, BSO, SLN biopsy, omental biopsy, peritoneal biopsy. Intraoperative findings were significant for  no gross extrauterine disease, no peritoneal nodularity, no ascites, no enlarged lymph nodes, normal-appearing omentum. Surgery was uncomplicated, She stayed overnight as a planned admission/observation for poorly controlled diabetes.  Final pathology revealed a FIGO grade2 endometrioid tumor measuring 2.5cm and involving the inner half of the myometrium (84m of 166mthickness). The ovaries, cervix, omentum, peritoneal biopsies and all lymph nodes were negative. The tumor was MMR normal, MSI stable.   Her pathology was reviewed at multidisciplinary tumor conference as were her preoperative CT scans.  It was determined that these were false positive findings after reviewed by a second radiologist.  She was determined to have high intermediate risk factors in her uterine specimen and therefore adjuvant vaginal brachytherapy was recommended in accordance with NCCN guidelines.  Interval Hx:  She received adjuvant radiation with vaginal brachytherapy between 02/25/20 through 03/12/20. She received 30 gray (in 6 fractions of 5Gy).  She tolerated therapy well with no issues.  She presented today for routine follow-up. Her daughters have enquired regarding their risk for endometrial cancer given that the patient has a history of uterine cancer and their grandmother has a history of ovarian cancer.   Current Meds:  Outpatient Encounter Medications as of 06/12/2020  Medication Sig   Continuous Blood Gluc Sensor (FREESTYLE LIBRE 2 SENSOR) MISC APPLY TO UPPER BACK OF ARM CHANGE EVERY 14 DAYS   DULoxetine (CYMBALTA) 60 MG capsule Take 60 mg by mouth daily.   FIASP FLEXTOUCH 100 UNIT/ML SOPN Inject 5-40 Units into the skin in the morning, at noon, and at bedtime.    fluconazole (DIFLUCAN) 100 MG tablet Take 1.5 tablets (150 mg total) by mouth daily.   insulin degludec (  TRESIBA FLEXTOUCH) 100 UNIT/ML SOPN FlexTouch Pen Inject 0.4 mLs (40 Units total) into the skin daily. (Patient taking differently: Inject 16  Units into the skin in the morning. )   losartan-hydrochlorothiazide (HYZAAR) 50-12.5 MG tablet Take 1 tablet by mouth daily.   meloxicam (MOBIC) 7.5 MG tablet Take 7.5 mg by mouth daily.   Multiple Vitamin (MULTIVITAMIN WITH MINERALS) TABS tablet Take 2 tablets by mouth daily.   pantoprazole (PROTONIX) 40 MG tablet Take 1 tablet (40 mg total) by mouth daily.   pravastatin (PRAVACHOL) 20 MG tablet Take 20 mg by mouth daily.   SYNTHROID 150 MCG tablet Take 150 mcg by mouth daily before breakfast.    No facility-administered encounter medications on file as of 06/12/2020.    Allergy: No Known Allergies  Social Hx:   Social History   Socioeconomic History   Marital status: Single    Spouse name: Not on file   Number of children: Not on file   Years of education: Not on file   Highest education level: Not on file  Occupational History   Not on file  Tobacco Use   Smoking status: Never Smoker   Smokeless tobacco: Never Used  Vaping Use   Vaping Use: Never used  Substance and Sexual Activity   Alcohol use: Not Currently    Comment: occasionally   Drug use: Never   Sexual activity: Not Currently  Other Topics Concern   Not on file  Social History Narrative   Not on file   Social Determinants of Health   Financial Resource Strain:    Difficulty of Paying Living Expenses: Not on file  Food Insecurity:    Worried About [place] in the Last Year: Not on file   Ran Out of Food in the Last Year: Not on file  Transportation Needs:    Lack of Transportation (Medical): Not on file   Lack of Transportation (Non-Medical): Not on file  Physical Activity:    Days of Exercise per Week: Not on file   Minutes of Exercise per Session: Not on file  Stress:    Feeling of Stress : Not on file  Social Connections:    Frequency of Communication with Friends and Family: Not on file   Frequency of Social Gatherings with Friends and Family: Not on file    Attends Religious Services: Not on file   Active Member of Clubs or Organizations: Not on file   Attends Archivist Meetings: Not on file   Marital Status: Not on file  Intimate Partner Violence:    Fear of Current or Ex-Partner: Not on file   Emotionally Abused: Not on file   Physically Abused: Not on file   Sexually Abused: Not on file    Past Surgical Hx:  Past Surgical History:  Procedure Laterality Date   CESAREAN SECTION     X2   DILATION AND CURETTAGE OF UTERUS     x 2   LAPAROSCOPIC GASTRIC SLEEVE RESECTION N/A 06/25/2019   Procedure: LAPAROSCOPIC GASTRIC SLEEVE RESECTION WITH HIATAL HERNIA REPAIR AND UPPER ENDOSCOPY, ERAS Pathway;  Surgeon: Clovis Riley, MD;  Location: [facility];  Service: General;  Laterality: N/A;   ROBOTIC ASSISTED TOTAL HYSTERECTOMY WITH BILATERAL SALPINGO OOPHERECTOMY Bilateral 01/14/2020   Procedure: XI ROBOTIC ASSISTED TOTAL HYSTERECTOMY WITH BILATERAL SALPINGO OOPHORECTOMY;  Surgeon: Everitt Amber, MD;  Location: [facility];  Service: Gynecology;  Laterality: Bilateral;   SENTINEL NODE BIOPSY N/A 01/14/2020   Procedure: SENTINEL NODE  BIOPSY; OMENTUM BIOPSY;  Surgeon: Everitt Amber, MD;  Location: [facility];  Service: Gynecology;  Laterality: N/A;   TONSILLECTOMY      Past Medical Hx:  Past Medical History:  Diagnosis Date   Anxiety    Arthritis    Complication of anesthesia    tends to aspirate   Depression    Diabetes mellitus without complication (HCC)    type 1  dexcom continous glucose meter   endometrial/uterine ca dx'd 12/2019   Fibromyalgia    neuropathy eyes   Headache    history of migraines   Hypertension    Hypothyroidism    PONV (postoperative nausea and vomiting)    Sleep apnea    cpap    Past Gynecological History:  Menopause at age 49, 2 prior cesarean sections. No LMP recorded. Patient is postmenopausal.  Family Hx:  Family History  Problem Relation Age of Onset   Cancer Other     Diabetes Other    Cataracts Mother    Ovarian cancer Mother    Cataracts Father     Review of Systems:  Constitutional  Feels well,    ENT Normal appearing ears and nares bilaterally Skin/Breast  No rash, sores, jaundice, itching, dryness Cardiovascular  No chest pain, shortness of breath, or edema  Pulmonary  No cough or wheeze.  Gastro Intestinal  No nausea, vomitting, or diarrhoea. No bright red blood per rectum, no abdominal pain, change in bowel movement, or constipation.  Genito Urinary  No frequency, urgency, dysuria, + postmenopausal bleeding Musculo Skeletal  No myalgia, arthralgia, joint swelling or pain  Neurologic  No weakness, numbness, change in gait,  Psychology  No depression, anxiety, insomnia.   Vitals:  Blood pressure (!) 142/62, pulse 62, temperature 97.8 F (36.6 C), temperature source Tympanic, resp. rate 18, weight 217 lb 6 oz (98.6 kg), SpO2 100 %.  Physical Exam: WD in NAD Neck  Supple NROM, without any enlargements.  Lymph Node Survey No cervical supraclavicular or inguinal adenopathy Cardiovascular  Pulse normal rate, regularity and rhythm. S1 and S2 normal.  Lungs  Clear to auscultation bilateraly, without wheezes/crackles/rhonchi. Good air movement.  Skin  No rash/lesions/breakdown  Psychiatry  Alert and oriented to person, place, and time  Abdomen  Normoactive bowel sounds, abdomen soft, non-tender and obese without evidence of hernia. Soft prior incisions.  Back No CVA tenderness Genito Urinary  Vagina cuff smooth, no visible or palpable lesions, no palpable pelvic masses.  Rectal  deferred Extremities  No bilateral cyanosis, clubbing or edema.  Thereasa Solo, MD  06/12/2020, 12:50 PM

## 2020-06-18 ENCOUNTER — Telehealth: Payer: Self-pay | Admitting: *Deleted

## 2020-06-18 NOTE — Telephone Encounter (Signed)
Called and left the patient a message to call the office back. Patient needs a genetics appt

## 2020-06-19 ENCOUNTER — Telehealth: Payer: Self-pay | Admitting: *Deleted

## 2020-06-19 NOTE — Telephone Encounter (Signed)
Called and left the patient a message to call the office back. Patient needs to be scheduled for a genetics appt

## 2020-06-24 ENCOUNTER — Telehealth: Payer: Self-pay | Admitting: *Deleted

## 2020-06-24 NOTE — Telephone Encounter (Signed)
Called and left the patient a message to call the office back. The patient needs to be scheduled for a genetics appt

## 2020-06-25 NOTE — Telephone Encounter (Signed)
Patient called the office back and scheduled an appt for genetics on 10/21

## 2020-06-29 ENCOUNTER — Ambulatory Visit: Payer: Medicare HMO | Admitting: Gynecologic Oncology

## 2020-07-16 ENCOUNTER — Encounter: Payer: Self-pay | Admitting: Genetic Counselor

## 2020-07-16 ENCOUNTER — Inpatient Hospital Stay: Payer: Medicare HMO

## 2020-07-16 ENCOUNTER — Other Ambulatory Visit: Payer: Self-pay | Admitting: Genetic Counselor

## 2020-07-16 ENCOUNTER — Other Ambulatory Visit: Payer: Self-pay

## 2020-07-16 ENCOUNTER — Inpatient Hospital Stay: Payer: Medicare HMO | Attending: Gynecologic Oncology | Admitting: Genetic Counselor

## 2020-07-16 DIAGNOSIS — C541 Malignant neoplasm of endometrium: Secondary | ICD-10-CM

## 2020-07-16 DIAGNOSIS — Z1379 Encounter for other screening for genetic and chromosomal anomalies: Secondary | ICD-10-CM | POA: Diagnosis not present

## 2020-07-16 DIAGNOSIS — Z803 Family history of malignant neoplasm of breast: Secondary | ICD-10-CM | POA: Diagnosis not present

## 2020-07-16 DIAGNOSIS — Z801 Family history of malignant neoplasm of trachea, bronchus and lung: Secondary | ICD-10-CM | POA: Insufficient documentation

## 2020-07-16 DIAGNOSIS — Z8041 Family history of malignant neoplasm of ovary: Secondary | ICD-10-CM | POA: Insufficient documentation

## 2020-07-16 DIAGNOSIS — Z8051 Family history of malignant neoplasm of kidney: Secondary | ICD-10-CM

## 2020-07-16 HISTORY — DX: Family history of malignant neoplasm of breast: Z80.3

## 2020-07-16 HISTORY — DX: Family history of malignant neoplasm of kidney: Z80.51

## 2020-07-16 HISTORY — DX: Family history of malignant neoplasm of ovary: Z80.41

## 2020-07-16 LAB — GENETIC SCREENING ORDER

## 2020-07-16 NOTE — Progress Notes (Signed)
REFERRING PROVIDER: Everitt Amber, MD [place],  [place] [address]  PRIMARY PROVIDER:  Jonathon Jordan, MD  PRIMARY REASON FOR VISIT:  1. Endometrial cancer ([place])   2. Family history of ovarian cancer   3. Family history of breast cancer   4. Family history of kidney cancer   5. Family history of lung cancer    HISTORY OF PRESENT ILLNESS:   Christine Mcknight, a 74 y.o. female, was seen for a [facility] cancer genetics consultation at the request of Dr. Stephanie Acre due to a personal history of endometrial cancer and family history of ovarian cancer.  Christine Mcknight presents to clinic today to discuss the possibility of a hereditary predisposition to cancer, to discuss genetic testing, and to further clarify her future cancer risks, as well as potential cancer risks for family members.   In 2021, at the age of 26, Christine Mcknight was diagnosed with MSI stable and MMR normal endometrial cancer. The treatment plan included total abdominal hysterectomy and bilateral salpingo-oophorectomy followed by adjuvant brachytherapy.   RISK FACTORS:  Menarche was at age 77.  First live birth at age 7.  OCP use for approximately 3-4 years.  Ovaries intact: no. See history noted above.  Hysterectomy: yes.  See history noted above.  Menopausal status: postmenopausal; periods stopped at age 71 HRT use: 0 years. Colonoscopy: yes; one year ago; polyps detected per patient; return in 5 years. Mammogram within the last year: no; most recent mammogram was seven years ago per patient Number of breast biopsies: 0. Up to date with pelvic exams: yes.   Past Medical History:  Diagnosis Date  . Anxiety   . Arthritis   . Complication of anesthesia    tends to aspirate  . Depression   . Diabetes mellitus without complication ([place])    type 1  dexcom continous glucose meter  . endometrial/uterine ca dx'd 12/2019  . Family history of breast cancer 07/16/2020  . Family history of kidney cancer 07/16/2020  .  Family history of ovarian cancer 07/16/2020  . Fibromyalgia    neuropathy eyes  . Headache    history of migraines  . Hypertension   . Hypothyroidism   . PONV (postoperative nausea and vomiting)   . Sleep apnea    cpap    Past Surgical History:  Procedure Laterality Date  . CESAREAN SECTION     X2  . DILATION AND CURETTAGE OF UTERUS     x 2  . LAPAROSCOPIC GASTRIC SLEEVE RESECTION N/A 06/25/2019   Procedure: LAPAROSCOPIC GASTRIC SLEEVE RESECTION WITH HIATAL HERNIA REPAIR AND UPPER ENDOSCOPY, ERAS Pathway;  Surgeon: Clovis Riley, MD;  Location: [facility];  Service: General;  Laterality: N/A;  . ROBOTIC ASSISTED TOTAL HYSTERECTOMY WITH BILATERAL SALPINGO OOPHERECTOMY Bilateral 01/14/2020   Procedure: XI ROBOTIC ASSISTED TOTAL HYSTERECTOMY WITH BILATERAL SALPINGO OOPHORECTOMY;  Surgeon: Everitt Amber, MD;  [facility];  Service: Gynecology;  Laterality: Bilateral;  . SENTINEL NODE BIOPSY N/A 01/14/2020   Procedure: SENTINEL NODE BIOPSY; OMENTUM BIOPSY;  Surgeon: Everitt Amber, MD;  [facility];  Service: Gynecology;  Laterality: N/A;  . TONSILLECTOMY      Social History   Socioeconomic History  . Marital status: Single    Spouse name: Not on file  . Number of children: Not on file  . Years of education: Not on file  . Highest education level: Not on file  Occupational History  . Not on file  Tobacco Use  . Smoking status: Never  Smoker  . Smokeless tobacco: Never Used  Vaping Use  . Vaping Use: Never used  Substance and Sexual Activity  . Alcohol use: Not Currently    Comment: occasionally  . Drug use: Never  . Sexual activity: Not Currently  Other Topics Concern  . Not on file  Social History Narrative  . Not on file   Social Determinants of Health   Financial Resource Strain:   . Difficulty of Paying Living Expenses: Not on file  Food Insecurity:   . Worried About Charity fundraiser in the Last Year: Not on file  . Ran Out of Food in the Last Year: Not on  file  Transportation Needs:   . Lack of Transportation (Medical): Not on file  . Lack of Transportation (Non-Medical): Not on file  Physical Activity:   . Days of Exercise per Week: Not on file  . Minutes of Exercise per Session: Not on file  Stress:   . Feeling of Stress : Not on file  Social Connections:   . Frequency of Communication with Friends and Family: Not on file  . Frequency of Social Gatherings with Friends and Family: Not on file  . Attends Religious Services: Not on file  . Active Member of Clubs or Organizations: Not on file  . Attends Archivist Meetings: Not on file  . Marital Status: Not on file     FAMILY HISTORY:  We obtained a detailed, 4-generation family history.  Significant diagnoses are listed below: Family History  Problem Relation Age of Onset  . Ovarian cancer Mother 93  . Breast cancer Maternal Aunt 70  . Cancer Paternal Uncle        uknown type; dx >50  . Kidney cancer Maternal Grandmother        dx 62s  . Cancer Paternal Grandmother        unknown type; breast?; dx >50  . Lung cancer Paternal Grandfather 72    Christine Mcknight has two daughters, ages 48 and 20, who do not have a history of cancer.  Christine Mcknight has two sisters, ages 37 and 60, who do not have a history of cancer.  Christine Mcknight mother was diagnosed with myelodysplasia in her 85s, ovarian cancer at 75, and passed away at age 9.  She stated that her mother had genetic testing for the BRCA genes approximately 7 years ago but does not have access to this report for review. Christine Mcknight maternal aunt was diagnosed with breast cancer at age 50 and passed away at 73. Christine Mcknight maternal grandmother had kidney/renal cancer diagnosed in her 22s and passed away in her 77s.  No other maternal family history of cancer was reported.  Christine Mcknight father is living at age 49. Her paternal uncle and paternal grandmother were diagnosed with an unknown cancer after the age of 31.  [name] paternal grandfather was diagnosed with lung cancer at age 40 and passed away at 73.  No other paternal family history of cancer was reported.   Christine Mcknight is unaware of previous family history of genetic testing for hereditary cancer risks other that those stated above. Patient's maternal ancestors are of English descent, and paternal ancestors are of English/German descent. There is no reported Ashkenazi Jewish ancestry. There is no known consanguinity.  GENETIC COUNSELING ASSESSMENT: Christine Mcknight is a 74 y.o. female with a personal history of endometrial cancer and family history of ovarian cancer which is somewhat suggestive of a hereditary cancer  syndrome and predisposition to cancer given the related cancers within the family. We, therefore, discussed and recommended the following at today's visit.   DISCUSSION: We discussed that 5 - 10% of cancer is hereditary.  We discussed that ovarian cancr, endometrial cancer, and renal cancer can be related by Lynch syndrome. We also discussed that ovarian and breast cancer can be associated with mutations in BRCA1 and BRCA2.  There are other genes that can be associated with hereditary ovarian and endometrial cancer syndromes.  Type of cancer risk and level of risk are gene-specific.  We discussed that testing is beneficial for several reasons including knowing how to follow individuals for their cancer risks and understanding if other family members could be at risk for cancer and allowing them to undergo genetic testing.   We reviewed the characteristics, features and inheritance patterns of hereditary cancer syndromes. We also discussed genetic testing, including the appropriate family members to test, the process of testing, insurance coverage and turn-around-time for results. We discussed the implications of a negative, positive, carrier and/or variant of uncertain significant result. We recommended Christine Mcknight pursue genetic testing for a panel  that includes genes associated with endometrial, breast, ovarian, and kidney cancer.   Christine Mcknight  was offered a common hereditary cancer panel (48 genes) and an expanded pan-cancer panel (85 genes). Christine Mcknight was informed of the benefits and limitations of each panel, including that expanded pan-cancer panels contain several preliminary evidence genes that do not have clear management guidelines at this point in time.  We also discussed that as the number of genes included on a panel increases, the chances of variants of uncertain significance increases.  After considering the benefits and limitations of each gene panel, Christine Mcknight  elected to have the expanded pan-cancer panel through [facility].  The Multi-Cancer Panel offered by [facility] includes sequencing and/or deletion duplication testing of the following 85 genes: AIP, ALK, APC, ATM, AXIN2,BAP1,  BARD1, BLM, BMPR1A, BRCA1, BRCA2, BRIP1, CASR, CDC73, CDH1, CDK4, CDKN1B, CDKN1C, CDKN2A (p14ARF), CDKN2A (p16INK4a), CEBPA, CHEK2, CTNNA1, DICER1, DIS3L2, EGFR (c.2369C>T, p.Thr790Met variant only), EPCAM (Deletion/duplication testing only), FH, FLCN, GATA2, GPC3, GREM1 (Promoter region deletion/duplication testing only), HOXB13 (c.251G>A, p.Gly84Glu), HRAS, KIT, MAX, MEN1, MET, MITF (c.952G>A, p.Glu318Lys variant only), MLH1, MSH2, MSH3, MSH6, MUTYH, NBN, NF1, NF2, NTHL1, PALB2, PDGFRA, PHOX2B, PMS2, POLD1, POLE, POT1, PRKAR1A, PTCH1, PTEN, RAD50, RAD51C, RAD51D, RB1, RECQL4, RET, RNF43, RUNX1, SDHAF2, SDHA (sequence changes only), SDHB, SDHC, SDHD, SMAD4, SMARCA4, SMARCB1, SMARCE1, STK11, SUFU, TERC, TERT, TMEM127, TP53, TSC1, TSC2, VHL, WRN and WT1.   Based on Christine Mcknight's personal history of endometrial cancer and family history of breast, ovarian, and kidney cancers, she meets NCCN medical criteria for genetic testing. Despite that she meets criteria, she may still have an out of pocket cost. We discussed that if her out of pocket cost for testing is  over $100, the laboratory will call and confirm whether she wants to proceed with testing.  If the out of pocket cost of testing is less than $100 she will be billed by the genetic testing laboratory.    PLAN: After considering the risks, benefits, and limitations, Christine Mcknight provided informed consent to pursue genetic testing and the blood sample was sent to [facility] for analysis of the Multi-Cancer Panel. Results should be available within approximately 3 weeks' time, at which point they will be disclosed by telephone to Ms. Swaggerty, as will any additional recommendations warranted by these results. Ms. Wesenberg will receive a summary  of her genetic counseling visit and a copy of her results once available. This information will also be available in Epic.   Lastly, we encouraged Ms. Harker to remain in contact with cancer genetics annually so that we can continuously update the family history and inform her of any changes in cancer genetics and testing that may be of benefit for this family.   Ms. Lagrange questions were answered to her satisfaction today. Our contact information was provided should additional questions or concerns arise. Thank you for the referral and allowing [place] to share in the care of your patient.   Amaiah Cristiano M. Joette Catching, [place], [facility] Certified Film/video [email or web address] (P) [phone]  The patient was seen for a total of 35 minutes in face-to-face genetic counseling.  This patient was discussed with Drs. Magrinat, Lindi Adie and/or Burr Medico who agrees with the above.    _______________________________________________________________________ For Office Staff:  Number of people involved in session: 1 Was an Intern/ student involved with case: no

## 2020-07-23 ENCOUNTER — Ambulatory Visit: Payer: Self-pay | Admitting: Genetic Counselor

## 2020-07-23 ENCOUNTER — Telehealth: Payer: Self-pay | Admitting: Genetic Counselor

## 2020-07-23 ENCOUNTER — Encounter: Payer: Self-pay | Admitting: Genetic Counselor

## 2020-07-23 DIAGNOSIS — Z8051 Family history of malignant neoplasm of kidney: Secondary | ICD-10-CM

## 2020-07-23 DIAGNOSIS — Z1379 Encounter for other screening for genetic and chromosomal anomalies: Secondary | ICD-10-CM

## 2020-07-23 DIAGNOSIS — Z803 Family history of malignant neoplasm of breast: Secondary | ICD-10-CM

## 2020-07-23 DIAGNOSIS — Z8041 Family history of malignant neoplasm of ovary: Secondary | ICD-10-CM

## 2020-07-23 DIAGNOSIS — Z801 Family history of malignant neoplasm of trachea, bronchus and lung: Secondary | ICD-10-CM

## 2020-07-23 DIAGNOSIS — C541 Malignant neoplasm of endometrium: Secondary | ICD-10-CM

## 2020-07-23 NOTE — Telephone Encounter (Signed)
Revealed negative genetic testing.  Discussed that we do not know why she had endometrial or why there is cancer in the family. It could be sporadic, due to a different gene that we are not testing, or maybe our current technology may not be able to pick something up.  It will be important for her to keep in contact with genetics to keep up with whether additional testing may be needed.

## 2020-07-23 NOTE — Progress Notes (Signed)
HPI:  Ms. Trickey was previously seen in the [facility] due to a personal and family history of cancer and concerns regarding a hereditary predisposition to cancer. Please refer to our prior cancer genetics clinic note for more information regarding our discussion, assessment and recommendations, at the time. Ms. Denunzio recent genetic test results were disclosed to her, as were recommendations warranted by these results. These results and recommendations are discussed in more detail below.  CANCER HISTORY:  In 2021, at the age of 74, Ms. 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 The treatment plan included total abdominal hysterectomy and bilateral salpingo-oophorectomy followed by adjuvant brachytherapy.   Oncology History  Endometrial cancer ([place])  01/07/2020 Initial Diagnosis   Endometrial cancer ([place])   07/22/2020 Genetic Testing   Negative genetic testing: no pathogenic variants detected in [facility] Multi-Cancer Panel. The report date is July 12, 2020.   The Multi-Cancer Panel offered by [facility] includes sequencing and/or deletion duplication testing of the following 85 genes: AIP, ALK, APC, ATM, AXIN2,BAP1,  BARD1, BLM, BMPR1A, BRCA1, BRCA2, BRIP1, CASR, CDC73, CDH1, CDK4, CDKN1B, CDKN1C, CDKN2A (p14ARF), CDKN2A (p16INK4a), CEBPA, CHEK2, CTNNA1, DICER1, DIS3L2, EGFR (c.2369C>T, p.Thr790Met variant only), EPCAM (Deletion/duplication testing only), FH, FLCN, GATA2, GPC3, GREM1 (Promoter region deletion/duplication testing only), HOXB13 (c.251G>A, p.Gly84Glu), HRAS, KIT, MAX, MEN1, MET, MITF (c.952G>A, p.Glu318Lys variant only), MLH1, MSH2, MSH3, MSH6, MUTYH, NBN, NF1, NF2, NTHL1, PALB2, PDGFRA, PHOX2B, PMS2, POLD1, POLE, POT1, PRKAR1A, PTCH1, PTEN, RAD50, RAD51C, RAD51D, RB1, RECQL4, RET, RNF43, RUNX1, SDHAF2, SDHA (sequence changes only), SDHB, SDHC, SDHD, SMAD4, SMARCA4, SMARCB1, SMARCE1, STK11, SUFU, TERC, TERT, TMEM127, TP53, TSC1,  TSC2, VHL, WRN and WT1.      FAMILY HISTORY:  We obtained a detailed, 4-generation family history.  Significant diagnoses are listed below: Family History  Problem Relation Age of Onset   Cancer Other    Ovarian cancer Mother 25   Breast cancer Maternal Aunt 61   Cancer Paternal Uncle        uknown type; dx >50   Kidney cancer Maternal Grandmother        dx 55s   Cancer Paternal Grandmother        unknown type; breast?; dx >50   Lung cancer Paternal Grandfather 23   Ms. Mcarthy has two daughters, ages 38 and 54, who do not have a history of cancer.  Ms. Lindfors has two sisters, ages 72 and 6, who do not have a history of cancer.  Ms. Camberos mother was diagnosed with myelodysplasia in her 69s, ovarian cancer at 78, and passed away at age 67.  She stated that her mother had genetic testing for the BRCA genes approximately 7 years ago but does not have access to this report for review. Ms. Christy maternal aunt was diagnosed with breast cancer at age 67 and passed away at 70. Ms. Gallego maternal grandmother had kidney/renal cancer diagnosed in her 71s and passed away in her 54s.  No other maternal family history of cancer was reported.  Ms. Iwai father is living at age 56. Her paternal uncle and paternal grandmother were diagnosed with an unknown cancer after the age of 30.  Ms. Shaver paternal grandfather was diagnosed with lung cancer at age 67 and passed away at 74.  No other paternal family history of cancer was reported.   Ms. Vohs is unaware of previous family history of genetic testing for hereditary cancer risks other that those stated above. Patient's maternal ancestors are of  English descent, and paternal ancestors are of English/German descent. There is no reported Ashkenazi Jewish ancestry. There is no known consanguinity.  GENETIC TEST RESULTS: Genetic testing reported out on July 22, 2020.  The [facility] Multi-Cancer Panel no pathogenic mutations. The  Multi-Cancer Panel offered by [facility] includes sequencing and/or deletion duplication testing of the following 85 genes: AIP, ALK, APC, ATM, AXIN2,BAP1,  BARD1, BLM, BMPR1A, BRCA1, BRCA2, BRIP1, CASR, CDC73, CDH1, CDK4, CDKN1B, CDKN1C, CDKN2A (p14ARF), CDKN2A (p16INK4a), CEBPA, CHEK2, CTNNA1, DICER1, DIS3L2, EGFR (c.2369C>T, p.Thr790Met variant only), EPCAM (Deletion/duplication testing only), FH, FLCN, GATA2, GPC3, GREM1 (Promoter region deletion/duplication testing only), HOXB13 (c.251G>A, p.Gly84Glu), HRAS, KIT, MAX, MEN1, MET, MITF (c.952G>A, p.Glu318Lys variant only), MLH1, MSH2, MSH3, MSH6, MUTYH, NBN, NF1, NF2, NTHL1, PALB2, PDGFRA, PHOX2B, PMS2, POLD1, POLE, POT1, PRKAR1A, PTCH1, PTEN, RAD50, RAD51C, RAD51D, RB1, RECQL4, RET, RNF43, RUNX1, SDHAF2, SDHA (sequence changes only), SDHB, SDHC, SDHD, SMAD4, SMARCA4, SMARCB1, SMARCE1, STK11, SUFU, TERC, TERT, TMEM127, TP53, TSC1, TSC2, VHL, WRN and WT1.   The test report has been scanned into EPIC and is located under the Molecular Pathology section of the Results Review tab.  A portion of the result report is included below for reference.     We discussed with Ms. Duhart that because current genetic testing is not perfect, it is possible there may be a gene mutation in one of these genes that current testing cannot detect, but that chance is small.  We also discussed, that there could be another gene that has not yet been discovered, or that we have not yet tested, that is responsible for the cancer diagnoses in the family. It is also possible there is a hereditary cause for the cancer in the family that Ms. Soller did not inherit and therefore was not identified in her testing.  Therefore, it is important to remain in touch with cancer genetics in the future so that we can continue to offer Ms. Willits the most up to date genetic testing.    ADDITIONAL GENETIC TESTING: We discussed with Ms. Castronova that her genetic testing was fairly extensive.  If  there are genes identified to increase cancer risk that can be analyzed in the future, we would be happy to discuss and coordinate this testing at that time.    CANCER SCREENING RECOMMENDATIONS: Ms. Leven test result is considered negative (normal).  This means that we have not identified a hereditary cause for her personal and family history of cancer at this time. Most cancers happen by chance and this negative test suggests that her cancer may fall into this category.    While reassuring, this does not definitively rule out a hereditary predisposition to cancer. It is still possible that there could be genetic mutations that are undetectable by current technology. There could be genetic mutations in genes that have not been tested or identified to increase cancer risk.  Therefore, it is recommended she continue to follow the cancer management and screening guidelines provided by her oncology and primary healthcare provider.   An individual's cancer risk and medical management are not determined by genetic test results alone. Overall cancer risk assessment incorporates additional factors, including personal medical history, family history, and any available genetic information that may result in a personalized plan for cancer prevention and surveillance  RECOMMENDATIONS FOR FAMILY MEMBERS:  Individuals in this family might be at some increased risk of developing cancer, over the general population risk, simply due to the family history of cancer.  We recommended women in this family  have a yearly mammogram beginning at age 41, or 76 years younger than the earliest onset of cancer, an annual clinical breast exam, and perform monthly breast self-exams. Women in this family should also have a gynecological exam as recommended by their primary provider. All family members should be referred for colonoscopy starting at age 11.  It is also possible there is a hereditary cause for the cancer in Ms.  Antigua's family that she did not inherit and therefore was not identified in her.  Based on Ms. Hice's family history of ovarian cancer in her mother, we recommended her sisters have genetic counseling and testing. Ms. Pullman will let us know if we can be of any assistance in coordinating genetic counseling and/or testing for this family member.   FOLLOW-UP: Lastly, we discussed with Ms. Borin that cancer genetics is a rapidly advancing field and it is possible that new genetic tests will be appropriate for her and/or her family members in the future. We encouraged her to remain in contact with cancer genetics on an annual basis so we can update her personal and family histories and let her know of advances in cancer genetics that may benefit this family.   Our contact number was provided. Ms. Lechuga questions were answered to her satisfaction, and she knows she is welcome to call us at anytime with additional questions or concerns.   Shreeya Recendiz M. Joette Catching, [place], [facility] Certified Film/video [email or web address] (P) [phone]

## 2020-08-18 DIAGNOSIS — G4710 Hypersomnia, unspecified: Secondary | ICD-10-CM | POA: Diagnosis not present

## 2020-08-18 DIAGNOSIS — G4733 Obstructive sleep apnea (adult) (pediatric): Secondary | ICD-10-CM | POA: Diagnosis not present

## 2020-09-07 ENCOUNTER — Telehealth: Payer: Self-pay | Admitting: *Deleted

## 2020-09-07 NOTE — Telephone Encounter (Signed)
CALLED PATIENT TO ALTER FU APPT. DUE TO DR. KINARD BEING ON VACATION, RESCHEDULED FOR 10-12-20 @ 10:30 AM, PATIENT AGREED TO NEW DATE AND TIME

## 2020-09-17 ENCOUNTER — Ambulatory Visit: Payer: Self-pay | Admitting: Radiation Oncology

## 2020-10-09 ENCOUNTER — Telehealth: Payer: Self-pay | Admitting: *Deleted

## 2020-10-09 NOTE — Telephone Encounter (Signed)
CALLED PATIENT TO ALTER FU ON 10-12-20 DUE TO INCLEMENT WEATHER, APPT. RESCHEDULED FOR 10-29-20 @ 11:30 AM,  LVM FOR A RETURN CALL

## 2020-10-12 ENCOUNTER — Ambulatory Visit: Payer: Medicare HMO | Admitting: Radiation Oncology

## 2020-10-29 ENCOUNTER — Ambulatory Visit
Admission: RE | Admit: 2020-10-29 | Discharge: 2020-10-29 | Disposition: A | Discharge status: Home / Self Care | Payer: Medicare HMO | Source: Ambulatory Visit | Attending: Radiation Oncology | Admitting: Radiation Oncology

## 2020-10-29 ENCOUNTER — Other Ambulatory Visit: Payer: Self-pay

## 2020-10-29 ENCOUNTER — Encounter: Payer: Self-pay | Admitting: Radiation Oncology

## 2020-10-29 DIAGNOSIS — R5383 Other fatigue: Secondary | ICD-10-CM | POA: Diagnosis not present

## 2020-10-29 DIAGNOSIS — C786 Secondary malignant neoplasm of retroperitoneum and peritoneum: Secondary | ICD-10-CM | POA: Diagnosis not present

## 2020-10-29 DIAGNOSIS — Z08 Encounter for follow-up examination after completed treatment for malignant neoplasm: Secondary | ICD-10-CM | POA: Diagnosis not present

## 2020-10-29 DIAGNOSIS — U071 COVID-19: Secondary | ICD-10-CM | POA: Diagnosis not present

## 2020-10-29 DIAGNOSIS — Z79899 Other long term (current) drug therapy: Secondary | ICD-10-CM | POA: Diagnosis not present

## 2020-10-29 DIAGNOSIS — C541 Malignant neoplasm of endometrium: Secondary | ICD-10-CM

## 2020-10-29 DIAGNOSIS — Z923 Personal history of irradiation: Secondary | ICD-10-CM | POA: Insufficient documentation

## 2020-10-29 NOTE — Progress Notes (Signed)
Patient is here today for follow up to endometrium completed June 2021.  She denies having any pain or discomfort. Denies diarrhea/constipation.  Denies nausea/vomiting.  Denies bladder problems.  Denies vaginal discharge and denies vaginal bleeding.  Appetite level is good.  Reports some fatigue.    Denies being sexually active and denies using the vaginal dilator.  Denies any issues at last pelvic exam with pain.  Vitals:   10/29/20 1130  BP: (!) 147/68  Pulse: 66  Resp: 18  Temp: (!) 96.7 F (35.9 C)  TempSrc: Temporal  SpO2: 100%  Weight: 217 lb 8 oz (98.7 kg)  Height: 5\' 5"  (1.651 m)

## 2020-10-29 NOTE — Progress Notes (Signed)
Radiation Oncology         [phone] ________________________________  Name: Christine Mcknight MRN: [id number]  Date: 10/29/2020  DOB: Jun 12, 1946  Follow-Up Visit Note  CC: Jonathon Jordan, MD  Everitt Amber, MD    ICD-10-CM   1. Endometrial cancer ([facility])  C54.1     Diagnosis: FIGO stage IA (pT1a, pN0) endometrioid endometrial cancer, grade 2  Interval Since Last Radiation: Seven months, two weeks, and three days  Radiation Treatment Dates: 02/25/2020 through 03/12/2020 Site Technique Total Dose (Gy) Dose per Fx (Gy) Completed Fx Beam Energies  Vagina: Pelvis HDR-brachy 30/30 6 5/5 Ir-192    Narrative:  The patient returns today for routine follow-up. She was last seen by Dr. Denman George on 06/12/2020, during which time there was no evidence of recurrence.  On review of systems, she reports some fatigue. Appetite has been stable. She denies pain, diarrhea, constipation, nausea, vomiting, bladder problems, vaginal discharge, and vaginal bleeding.      The patient reports having a COVID-19 infection over the holiday.  Despite having recommended vaccines.  She reports having a headache with no significant symptoms and did not require any admission to the hospital.  She reports several family members also contracting the virus.       ALLERGIES:  has No Known Allergies.  Meds: Current Outpatient Medications  Medication Sig Dispense Refill  . Continuous Blood Gluc Sensor (FREESTYLE LIBRE 2 SENSOR) MISC APPLY TO UPPER BACK OF ARM CHANGE EVERY 14 DAYS    . DULoxetine (CYMBALTA) 60 MG capsule Take 60 mg by mouth daily.    . insulin aspart (NOVOLOG) 100 UNIT/ML injection Inject into the skin 3 (three) times daily before meals. Sliding scale    . insulin NPH Human (NOVOLIN N) 100 UNIT/ML injection Inject 22 Units into the skin 2 (two) times daily before a meal. 11 units in morning and 11 units at night    . losartan-hydrochlorothiazide (HYZAAR) 50-12.5 MG tablet Take 1 tablet by mouth daily.    .  meloxicam (MOBIC) 7.5 MG tablet Take 7.5 mg by mouth daily.    . Multiple Vitamin (MULTIVITAMIN WITH MINERALS) TABS tablet Take 2 tablets by mouth daily.    . pravastatin (PRAVACHOL) 20 MG tablet Take 20 mg by mouth daily.    Marland Kitchen SYNTHROID 150 MCG tablet Take 150 mcg by mouth daily before breakfast.      No current facility-administered medications for this encounter.    Physical Findings: The patient is in no acute distress. Patient is alert and oriented.  height is 5\' 5"  (1.651 m) and weight is 217 lb 8 oz (98.7 kg). Her temporal temperature is 96.7 F (35.9 C) (abnormal). Her blood pressure is 147/68 (abnormal) and her pulse is 66. Her respiration is 18 and oxygen saturation is 100%.   Lungs are clear to auscultation bilaterally. Heart has regular rate and rhythm. No palpable cervical, supraclavicular, or axillary adenopathy. Abdomen soft, non-tender, normal bowel sounds. On pelvic examination the external genitalia were unremarkable. A speculum exam was performed. There are no mucosal lesions noted in the vaginal vault. On bimanual and rectovaginal examination there were no pelvic masses appreciated.   Lab Findings: Lab Results  Component Value Date   WBC 9.2 01/15/2020   HGB 10.1 (L) 01/15/2020   HCT 30.7 (L) 01/15/2020   MCV 93.9 01/15/2020   PLT 248 01/15/2020    Radiographic Findings: No results found.  Impression: FIGO stage IA (pT1a, pN0) endometrioid endometrial cancer, grade 2  No evidence of  recurrence on clinical exam today.   Plan: The patient will follow-up with Dr. Denman George in three months and with radiation oncology in six months.   Total time spent in this encounter was 20 minutes which included reviewing the patient's most recent follow-up with Dr. Denman George, physical examination, and documentation.  ____________________________________   Blair Promise, PhD, MD  This document serves as a record of services personally performed by Gery Pray, MD. It was created  on his behalf by Clerance Lav, a trained medical scribe. The creation of this record is based on the scribe's personal observations and the provider's statements to them. This document has been checked and approved by the attending provider.

## 2020-12-15 ENCOUNTER — Telehealth: Payer: Self-pay | Admitting: *Deleted

## 2020-12-15 NOTE — Telephone Encounter (Signed)
CALLED PATIENT TO INFORM OF FU WITH DR. Denman George ON 02-09-21- ARRIVAL TIME- 12:30 PM, SPOKE WITH PATIENT AND SHE IS AWARE OF THIS APPT.

## 2020-12-23 DIAGNOSIS — Z90710 Acquired absence of both cervix and uterus: Secondary | ICD-10-CM | POA: Diagnosis not present

## 2020-12-23 DIAGNOSIS — R69 Illness, unspecified: Secondary | ICD-10-CM | POA: Diagnosis not present

## 2020-12-23 DIAGNOSIS — Z9884 Bariatric surgery status: Secondary | ICD-10-CM | POA: Diagnosis not present

## 2020-12-23 DIAGNOSIS — Z794 Long term (current) use of insulin: Secondary | ICD-10-CM | POA: Diagnosis not present

## 2020-12-23 DIAGNOSIS — E7800 Pure hypercholesterolemia, unspecified: Secondary | ICD-10-CM | POA: Diagnosis not present

## 2020-12-23 DIAGNOSIS — M797 Fibromyalgia: Secondary | ICD-10-CM | POA: Diagnosis not present

## 2020-12-23 DIAGNOSIS — Z79899 Other long term (current) drug therapy: Secondary | ICD-10-CM | POA: Diagnosis not present

## 2020-12-23 DIAGNOSIS — E039 Hypothyroidism, unspecified: Secondary | ICD-10-CM | POA: Diagnosis not present

## 2020-12-23 DIAGNOSIS — I10 Essential (primary) hypertension: Secondary | ICD-10-CM | POA: Diagnosis not present

## 2020-12-23 DIAGNOSIS — E1065 Type 1 diabetes mellitus with hyperglycemia: Secondary | ICD-10-CM | POA: Diagnosis not present

## 2020-12-23 DIAGNOSIS — C541 Malignant neoplasm of endometrium: Secondary | ICD-10-CM | POA: Diagnosis not present

## 2021-01-11 ENCOUNTER — Encounter (HOSPITAL_COMMUNITY): Payer: Self-pay | Admitting: *Deleted

## 2021-01-18 ENCOUNTER — Telehealth: Payer: Self-pay | Admitting: *Deleted

## 2021-01-18 NOTE — Telephone Encounter (Signed)
Patient called and canceled appt for 5/17; patient has moved to [place], [place]. Patient requested a recommendation for GYN ONC out that way.

## 2021-01-19 ENCOUNTER — Telehealth: Payer: Self-pay

## 2021-01-19 NOTE — Telephone Encounter (Signed)
LM for Ms Spaziani stating that Dr. Denman George  recommends Dr. Caryl Pina Case at [facility] in [place], [place]. The office number is [phone]. She can call the office at [phone]  If she has any questions or concerns.

## 2021-02-09 ENCOUNTER — Ambulatory Visit: Payer: Medicare HMO | Admitting: Gynecologic Oncology

## 2021-02-19 DIAGNOSIS — Z1231 Encounter for screening mammogram for malignant neoplasm of breast: Secondary | ICD-10-CM | POA: Diagnosis not present

## 2021-02-19 DIAGNOSIS — Z0000 Encounter for general adult medical examination without abnormal findings: Secondary | ICD-10-CM | POA: Diagnosis not present

## 2021-02-19 DIAGNOSIS — Z1239 Encounter for other screening for malignant neoplasm of breast: Secondary | ICD-10-CM | POA: Diagnosis not present

## 2021-02-19 DIAGNOSIS — E109 Type 1 diabetes mellitus without complications: Secondary | ICD-10-CM | POA: Diagnosis not present

## 2021-02-19 DIAGNOSIS — M8588 Other specified disorders of bone density and structure, other site: Secondary | ICD-10-CM | POA: Diagnosis not present

## 2021-02-19 DIAGNOSIS — Z9884 Bariatric surgery status: Secondary | ICD-10-CM | POA: Diagnosis not present

## 2021-03-02 DIAGNOSIS — G4733 Obstructive sleep apnea (adult) (pediatric): Secondary | ICD-10-CM | POA: Diagnosis not present

## 2021-03-02 DIAGNOSIS — G4710 Hypersomnia, unspecified: Secondary | ICD-10-CM | POA: Diagnosis not present

## 2021-03-16 DIAGNOSIS — Z1231 Encounter for screening mammogram for malignant neoplasm of breast: Secondary | ICD-10-CM | POA: Diagnosis not present

## 2021-03-16 DIAGNOSIS — M8588 Other specified disorders of bone density and structure, other site: Secondary | ICD-10-CM | POA: Diagnosis not present

## 2021-04-14 ENCOUNTER — Telehealth: Payer: Self-pay | Admitting: *Deleted

## 2021-04-14 NOTE — Telephone Encounter (Signed)
CALLED PATIENT TO ALTER FU ON 04-29-21 DUE TO DR. KINARD BEING IN THE OR, RESCHEDULED FOR 05-13-21 @ 9 AM, LVM FOR A RETURN CALL

## 2021-04-29 ENCOUNTER — Ambulatory Visit: Payer: Medicare HMO | Admitting: Radiation Oncology

## 2021-05-04 ENCOUNTER — Encounter: Payer: Self-pay | Admitting: Radiology

## 2021-05-12 ENCOUNTER — Telehealth: Payer: Self-pay | Admitting: *Deleted

## 2021-05-12 NOTE — Telephone Encounter (Signed)
CALLED PATIENT TO ASK ABOUT RESCHEDULING FU FOR 05-13-21 DUE TO DR. KINARD BEING IN THE OR, RESCHEDULED FOR 05-27-21 @ 8:30 AM, LVM FOR A RETURN CALL

## 2021-05-13 ENCOUNTER — Ambulatory Visit: Payer: Medicare HMO | Admitting: Radiation Oncology

## 2021-05-13 DIAGNOSIS — Z0000 Encounter for general adult medical examination without abnormal findings: Secondary | ICD-10-CM | POA: Diagnosis not present

## 2021-05-13 DIAGNOSIS — Z2821 Immunization not carried out because of patient refusal: Secondary | ICD-10-CM | POA: Diagnosis not present

## 2021-05-13 DIAGNOSIS — Z1211 Encounter for screening for malignant neoplasm of colon: Secondary | ICD-10-CM | POA: Diagnosis not present

## 2021-05-20 DIAGNOSIS — E038 Other specified hypothyroidism: Secondary | ICD-10-CM | POA: Diagnosis not present

## 2021-05-20 DIAGNOSIS — G63 Polyneuropathy in diseases classified elsewhere: Secondary | ICD-10-CM | POA: Diagnosis not present

## 2021-05-20 DIAGNOSIS — Z9884 Bariatric surgery status: Secondary | ICD-10-CM | POA: Diagnosis not present

## 2021-05-20 DIAGNOSIS — E559 Vitamin D deficiency, unspecified: Secondary | ICD-10-CM | POA: Diagnosis not present

## 2021-05-20 DIAGNOSIS — E0843 Diabetes mellitus due to underlying condition with diabetic autonomic (poly)neuropathy: Secondary | ICD-10-CM | POA: Diagnosis not present

## 2021-05-20 DIAGNOSIS — K9089 Other intestinal malabsorption: Secondary | ICD-10-CM | POA: Diagnosis not present

## 2021-05-20 DIAGNOSIS — E785 Hyperlipidemia, unspecified: Secondary | ICD-10-CM | POA: Diagnosis not present

## 2021-05-20 DIAGNOSIS — Z794 Long term (current) use of insulin: Secondary | ICD-10-CM | POA: Diagnosis not present

## 2021-05-20 DIAGNOSIS — E063 Autoimmune thyroiditis: Secondary | ICD-10-CM | POA: Diagnosis not present

## 2021-05-21 DIAGNOSIS — R69 Illness, unspecified: Secondary | ICD-10-CM | POA: Diagnosis not present

## 2021-05-21 DIAGNOSIS — Z23 Encounter for immunization: Secondary | ICD-10-CM | POA: Diagnosis not present

## 2021-05-21 DIAGNOSIS — E109 Type 1 diabetes mellitus without complications: Secondary | ICD-10-CM | POA: Diagnosis not present

## 2021-05-21 DIAGNOSIS — I10 Essential (primary) hypertension: Secondary | ICD-10-CM | POA: Diagnosis not present

## 2021-05-24 DIAGNOSIS — Z794 Long term (current) use of insulin: Secondary | ICD-10-CM | POA: Diagnosis not present

## 2021-05-24 DIAGNOSIS — E109 Type 1 diabetes mellitus without complications: Secondary | ICD-10-CM | POA: Diagnosis not present

## 2021-05-25 DIAGNOSIS — Z09 Encounter for follow-up examination after completed treatment for conditions other than malignant neoplasm: Secondary | ICD-10-CM | POA: Diagnosis not present

## 2021-05-25 DIAGNOSIS — Z6834 Body mass index (BMI) 34.0-34.9, adult: Secondary | ICD-10-CM | POA: Diagnosis not present

## 2021-05-27 ENCOUNTER — Ambulatory Visit: Payer: Medicare HMO | Admitting: Radiation Oncology

## 2021-06-11 DIAGNOSIS — Z6835 Body mass index (BMI) 35.0-35.9, adult: Secondary | ICD-10-CM | POA: Diagnosis not present

## 2021-06-11 DIAGNOSIS — Z9884 Bariatric surgery status: Secondary | ICD-10-CM | POA: Diagnosis not present

## 2021-06-11 DIAGNOSIS — G4733 Obstructive sleep apnea (adult) (pediatric): Secondary | ICD-10-CM | POA: Diagnosis not present

## 2021-06-11 DIAGNOSIS — E109 Type 1 diabetes mellitus without complications: Secondary | ICD-10-CM | POA: Diagnosis not present

## 2021-06-11 DIAGNOSIS — I10 Essential (primary) hypertension: Secondary | ICD-10-CM | POA: Diagnosis not present

## 2021-06-15 DIAGNOSIS — E1151 Type 2 diabetes mellitus with diabetic peripheral angiopathy without gangrene: Secondary | ICD-10-CM | POA: Diagnosis not present

## 2021-06-15 DIAGNOSIS — M79675 Pain in left toe(s): Secondary | ICD-10-CM | POA: Diagnosis not present

## 2021-06-15 DIAGNOSIS — B351 Tinea unguium: Secondary | ICD-10-CM | POA: Diagnosis not present

## 2021-06-15 DIAGNOSIS — I739 Peripheral vascular disease, unspecified: Secondary | ICD-10-CM | POA: Diagnosis not present

## 2021-06-22 DIAGNOSIS — H2513 Age-related nuclear cataract, bilateral: Secondary | ICD-10-CM | POA: Diagnosis not present

## 2021-06-22 DIAGNOSIS — E10311 Type 1 diabetes mellitus with unspecified diabetic retinopathy with macular edema: Secondary | ICD-10-CM | POA: Diagnosis not present

## 2021-06-22 DIAGNOSIS — H40033 Anatomical narrow angle, bilateral: Secondary | ICD-10-CM | POA: Diagnosis not present

## 2021-06-23 DIAGNOSIS — Z794 Long term (current) use of insulin: Secondary | ICD-10-CM | POA: Diagnosis not present

## 2021-06-23 DIAGNOSIS — E109 Type 1 diabetes mellitus without complications: Secondary | ICD-10-CM | POA: Diagnosis not present

## 2021-06-29 DIAGNOSIS — E109 Type 1 diabetes mellitus without complications: Secondary | ICD-10-CM | POA: Diagnosis not present

## 2021-06-29 DIAGNOSIS — Z6835 Body mass index (BMI) 35.0-35.9, adult: Secondary | ICD-10-CM | POA: Diagnosis not present

## 2021-06-29 DIAGNOSIS — Z09 Encounter for follow-up examination after completed treatment for conditions other than malignant neoplasm: Secondary | ICD-10-CM | POA: Diagnosis not present

## 2021-07-02 DIAGNOSIS — I7090 Unspecified atherosclerosis: Secondary | ICD-10-CM | POA: Diagnosis not present

## 2021-07-02 DIAGNOSIS — I739 Peripheral vascular disease, unspecified: Secondary | ICD-10-CM | POA: Diagnosis not present

## 2021-07-20 DIAGNOSIS — E1151 Type 2 diabetes mellitus with diabetic peripheral angiopathy without gangrene: Secondary | ICD-10-CM | POA: Diagnosis not present

## 2021-07-20 DIAGNOSIS — B351 Tinea unguium: Secondary | ICD-10-CM | POA: Diagnosis not present

## 2021-07-20 DIAGNOSIS — L84 Corns and callosities: Secondary | ICD-10-CM | POA: Diagnosis not present

## 2021-07-20 DIAGNOSIS — I739 Peripheral vascular disease, unspecified: Secondary | ICD-10-CM | POA: Diagnosis not present

## 2021-07-20 DIAGNOSIS — M2041 Other hammer toe(s) (acquired), right foot: Secondary | ICD-10-CM | POA: Diagnosis not present

## 2021-07-20 DIAGNOSIS — M79675 Pain in left toe(s): Secondary | ICD-10-CM | POA: Diagnosis not present

## 2021-07-20 DIAGNOSIS — M2042 Other hammer toe(s) (acquired), left foot: Secondary | ICD-10-CM | POA: Diagnosis not present

## 2021-07-22 DIAGNOSIS — E113313 Type 2 diabetes mellitus with moderate nonproliferative diabetic retinopathy with macular edema, bilateral: Secondary | ICD-10-CM | POA: Diagnosis not present

## 2021-07-23 DIAGNOSIS — E109 Type 1 diabetes mellitus without complications: Secondary | ICD-10-CM | POA: Diagnosis not present

## 2021-07-23 DIAGNOSIS — Z794 Long term (current) use of insulin: Secondary | ICD-10-CM | POA: Diagnosis not present

## 2021-08-23 ENCOUNTER — Telehealth: Payer: Self-pay

## 2021-08-23 NOTE — Telephone Encounter (Signed)
Received call from Ms. Franzoni. Patient has relocated to [place], [place] and is requesting a referral for Dr. Caryl Pina Case at [facility]. Patient requesting any images, scans or notes be sent via fax to [phone]. Fax successfully sent.

## 2022-01-14 ENCOUNTER — Encounter (HOSPITAL_COMMUNITY): Payer: Self-pay | Admitting: *Deleted

## 2022-01-25 IMAGING — CT CT CHEST W/ CM
2 of 6 series · 12 of 36 positions shown, 15 images · IV contrast (APPLIED)
Comparison: None

CLINICAL DATA: High-grade endometrial carcinoma. Evaluate for
distant metastatic disease.

EXAM:
CT CHEST, ABDOMEN, AND PELVIS WITH CONTRAST
TECHNIQUE: Multidetector CT imaging of the chest, abdomen and pelvis was
performed following the standard protocol during bolus
administration of intravenous contrast.
CONTRAST:  100mL OMNIPAQUE IOHEXOL 300 MG/ML  SOLN

[Series 4: cap with · axial · 0.77mm/px · z∈[-703,-223]mm · 9 of 122 slices shown, 12 images]
[im 13/122  mediastinal]
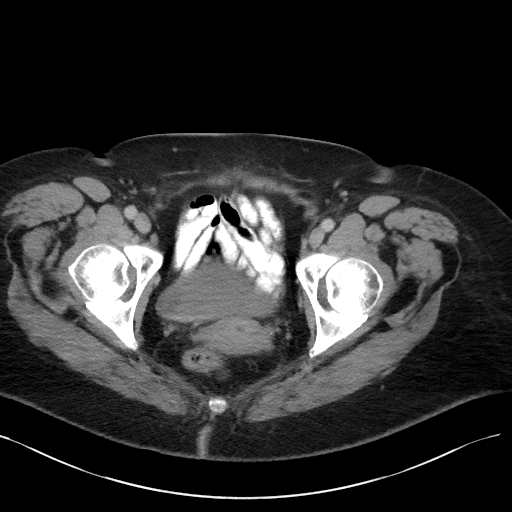
[im 13/122  lung]
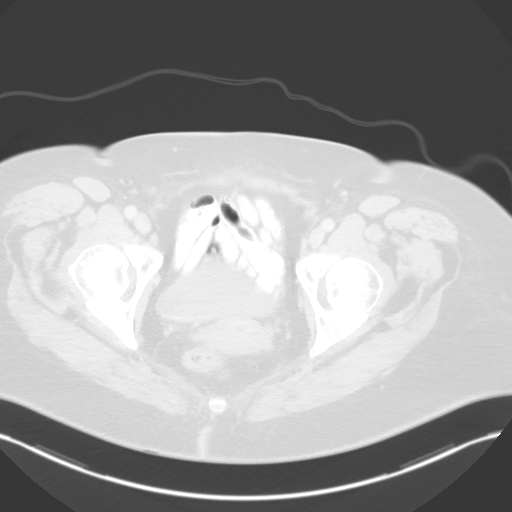
[im 25/122  lung]
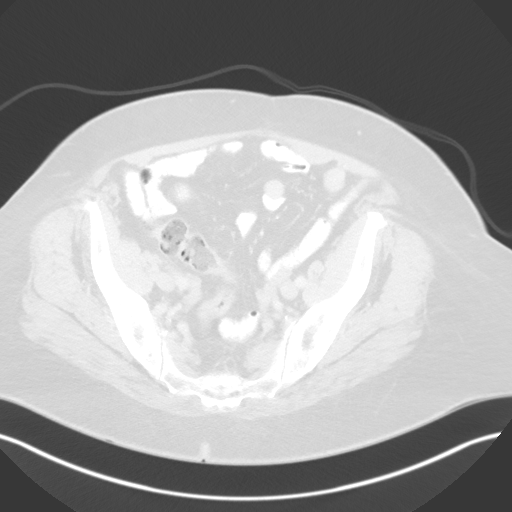
[im 37/122  lung]
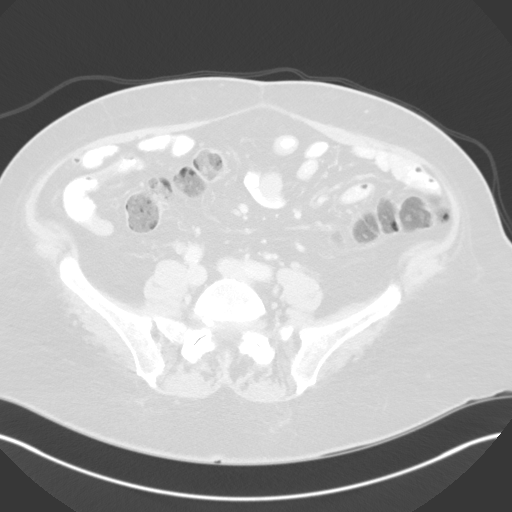
[im 49/122  lung]
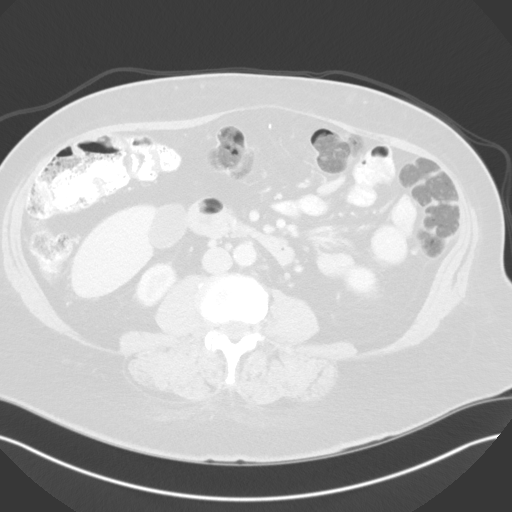
[im 61/122  mediastinal]
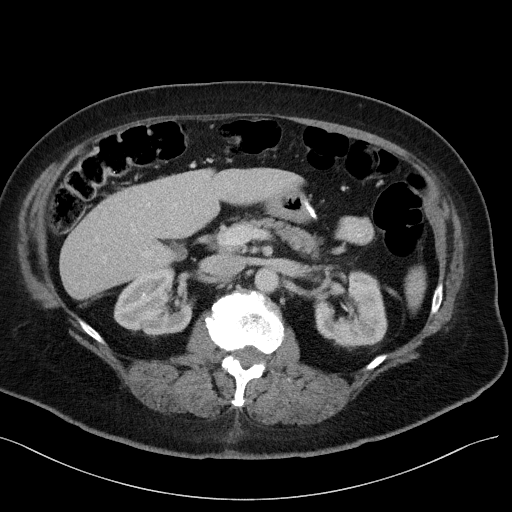
[im 61/122  lung]
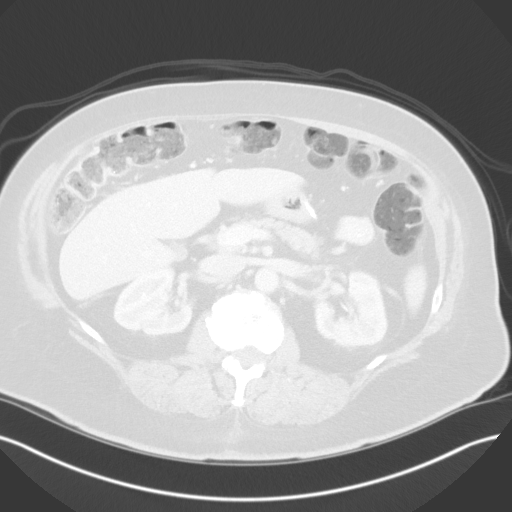
[im 73/122  lung]
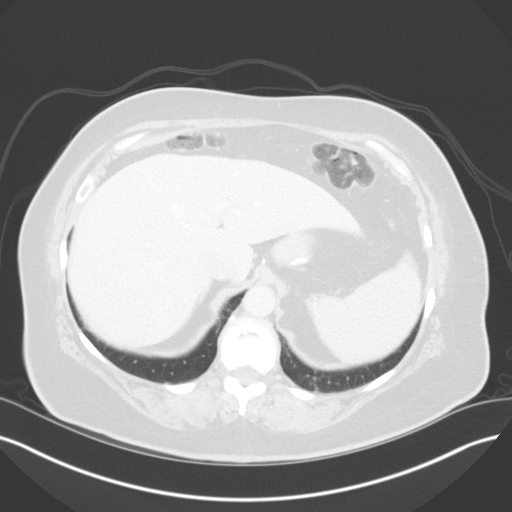
[im 85/122  lung]
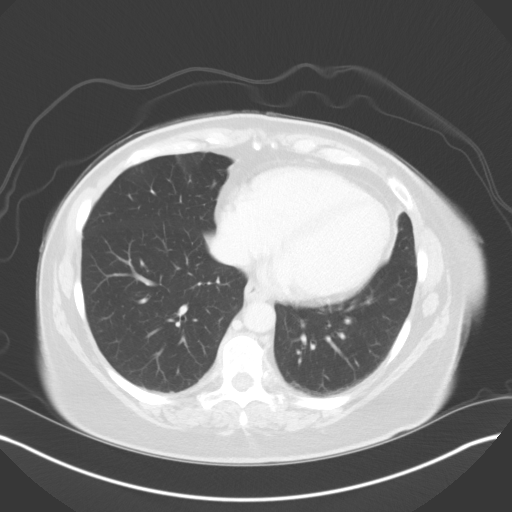
[im 97/122  lung]
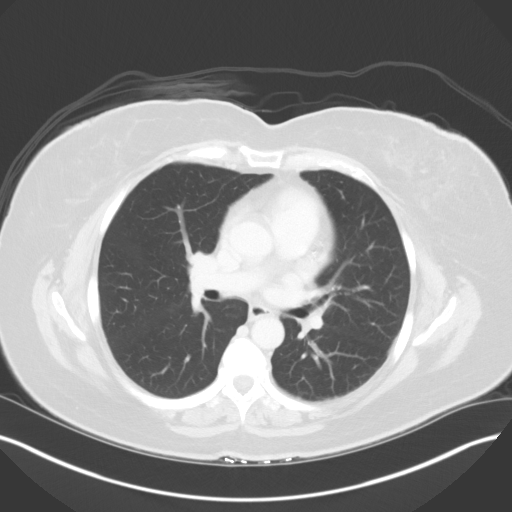
[im 109/122  mediastinal]
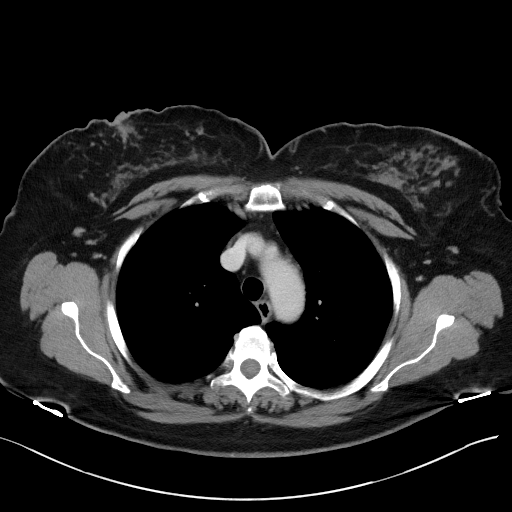
[im 109/122  lung]
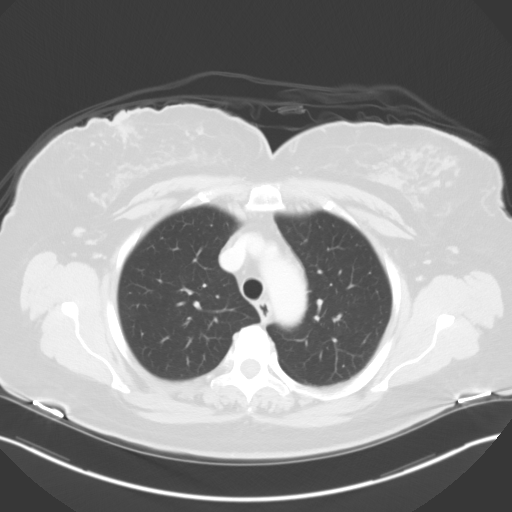

[Series 7: coronals · coronal · 0.77mm/px · 3 of 134 slices shown]
[im 27/134  lung]
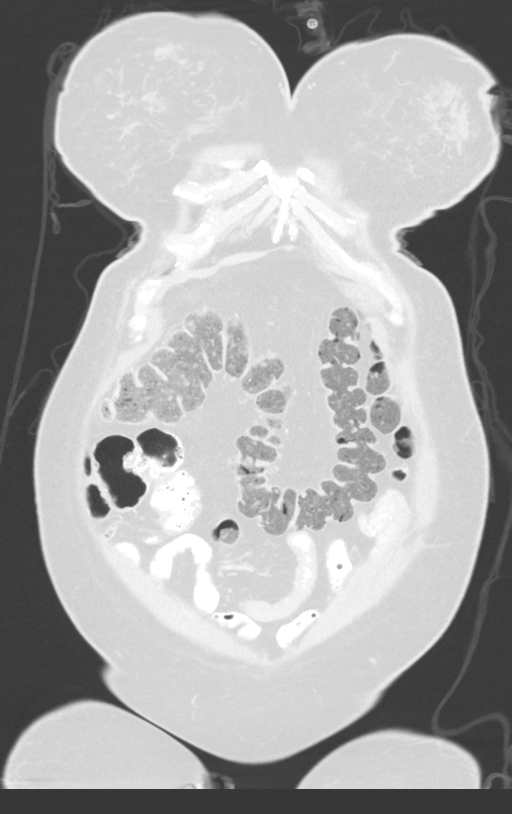
[im 54/134  lung]
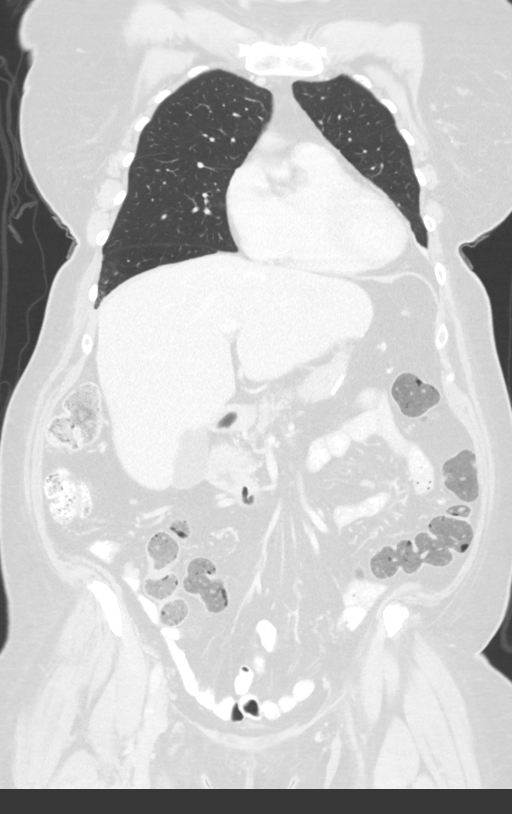
[im 80/134  lung]
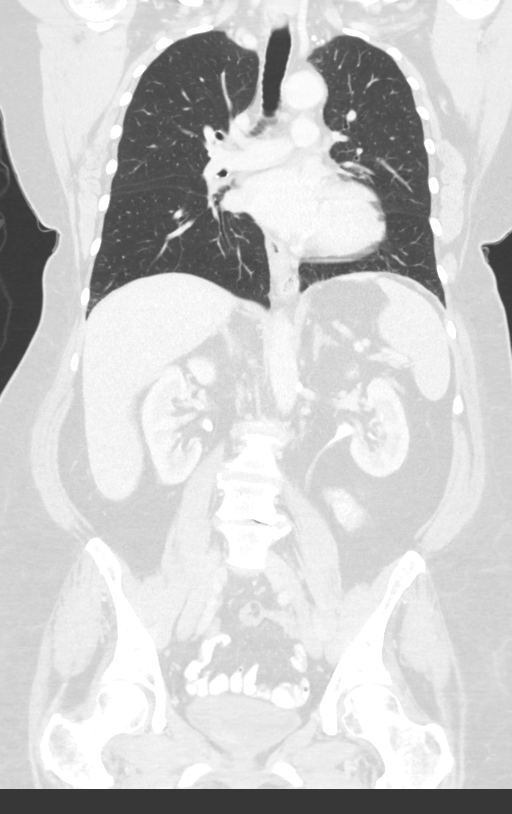

[12 of 36 positions shown; findings below may reference images not displayed]

FINDINGS: CT CHEST FINDINGS

Cardiovascular: The heart size appears within normal limits. Aortic
atherosclerosis. Lad and RCA coronary artery atherosclerotic
calcifications.

Mediastinum/Nodes: Normal appearance of the thyroid gland. The
trachea appears patent and is midline. Normal appearance of the
esophagus. No enlarged mediastinal or hilar lymph nodes.

Lungs/Pleura: No pleural effusion. Calcified granuloma identified in
the right middle lobe, image 73/6. No suspicious lung nodules.

Musculoskeletal: Mild spondylosis within the lower thoracic spine.
No suspicious bone lesions identified.

CT ABDOMEN PELVIS FINDINGS

Hepatobiliary: No suspicious liver lesion. Gallbladder negative. No
biliary dilatation.

Pancreas: Unremarkable. No pancreatic ductal dilatation or
surrounding inflammatory changes.

Spleen: Normal in size without focal abnormality.

Adrenals/Urinary Tract: Normal appearance of the adrenal glands. No
kidney mass or hydronephrosis. Urinary bladder unremarkable.

Stomach/Bowel: Postop change from sleeve gastrectomy. No dilated
loops of small or large bowel. No bowel wall thickening,
inflammation or distension.

Vascular/Lymphatic: Aortic atherosclerosis. No aneurysm. Scattered
small, less than 1 cm retroperitoneal lymph nodes. No pelvic or
inguinal adenopathy identified.

Reproductive: The uterus appears unremarkable. No adnexal mass

Other: No ascites. Multiple small round solid nodules are identified
within the abdomen.

Index nodule adjacent to posterior right hepatic lobe measures 1 cm,
image 55/4. Soft tissue nodule within the left upper quadrant of the
abdomen measures 8 mm, image 48/7. Nodular peritoneal thickening
within the right hemipelvis is noted, image 84/7. Soft tissue nodule
within the posterior right pelvis adjacent to the uterus measures
1.2 cm, image 105/4.

Musculoskeletal: No acute or significant osseous findings.
IMPRESSION: 1. Multiple small round solid nodules within the abdomen and pelvis
are identified compatible with peritoneal carcinomatosis.
2. No evidence for solid organ metastasis or metastatic disease to
the chest.
3. Multi vessel coronary artery atherosclerotic calcifications
noted.

Aortic Atherosclerosis (FDV1P-1L1.1).
